# Patient Record
Sex: Female | Born: 1935 | Hispanic: No | Smoking: Former smoker
Health system: Southern US, Community
[De-identification: ages and names within clinical notes are randomized; demographics above are authoritative.]

## PROBLEM LIST (undated history)

## (undated) DIAGNOSIS — M810 Age-related osteoporosis without current pathological fracture: Secondary | ICD-10-CM

## (undated) DIAGNOSIS — I10 Essential (primary) hypertension: Secondary | ICD-10-CM

## (undated) DIAGNOSIS — H3530 Unspecified macular degeneration: Secondary | ICD-10-CM

## (undated) DIAGNOSIS — G629 Polyneuropathy, unspecified: Secondary | ICD-10-CM

## (undated) HISTORY — DX: Age-related osteoporosis without current pathological fracture: M81.0

## (undated) HISTORY — DX: Polyneuropathy, unspecified: G62.9

## (undated) HISTORY — DX: Essential (primary) hypertension: I10

## (undated) HISTORY — DX: Unspecified macular degeneration: H35.30

---

## 2011-04-15 ENCOUNTER — Ambulatory Visit (INDEPENDENT_AMBULATORY_CARE_PROVIDER_SITE_OTHER): Payer: Medicare Other

## 2011-04-15 DIAGNOSIS — J45909 Unspecified asthma, uncomplicated: Secondary | ICD-10-CM

## 2011-04-15 DIAGNOSIS — J069 Acute upper respiratory infection, unspecified: Secondary | ICD-10-CM

## 2011-07-21 ENCOUNTER — Ambulatory Visit: Payer: Medicare Other

## 2011-07-21 ENCOUNTER — Ambulatory Visit (INDEPENDENT_AMBULATORY_CARE_PROVIDER_SITE_OTHER): Payer: Medicare Other | Admitting: Family Medicine

## 2011-07-21 VITALS — BP 163/80 | HR 69 | Temp 97.6°F | Resp 18 | Ht 62.25 in | Wt 168.8 lb

## 2011-07-21 DIAGNOSIS — R05 Cough: Secondary | ICD-10-CM

## 2011-07-21 DIAGNOSIS — R059 Cough, unspecified: Secondary | ICD-10-CM

## 2011-07-21 DIAGNOSIS — R062 Wheezing: Secondary | ICD-10-CM

## 2011-07-21 DIAGNOSIS — R0989 Other specified symptoms and signs involving the circulatory and respiratory systems: Secondary | ICD-10-CM

## 2011-07-21 MED ORDER — BENZONATATE 100 MG PO CAPS: 100.0000 mg | ORAL_CAPSULE | Freq: Two times a day (BID) | ORAL | Status: AC | PRN

## 2011-07-21 NOTE — Progress Notes (Signed)
[facility]:  Office Visit  Chief Complaint:  Chief Complaint  Patient presents with  . Cough  . Wheezing    HPI: Katrina Beck is a 78 y.o. female who complains of  Recurrent dry cough. Denies any other symptoms. Denies allergies/asthma or reflux. She has had this for about 3-4 weeks. Has been seen twice. Her Doctor in [place], [place] gave her a Z-pack about 2 weeks and that seems to improve her symptoms. She is leaving to go to [place] in 10 days and wants to make sure she is not sick.   Past Medical History  Diagnosis Date  . Macular degeneration   . Hypertension   . Neuropathy   . Asthma    History reviewed. No pertinent past surgical history. History   Social History  . Marital Status: Unknown    Spouse Name: N/A    Number of Children: N/A  . Years of Education: N/A   Social History Main Topics  . Smoking status: Former Games developer  . Smokeless tobacco: None  . Alcohol Use: No  . Drug Use: No  . Sexually Active: None   Other Topics Concern  . None   Social History Narrative  . None   No family history on file. No Known Allergies Prior to Admission medications   Medication Sig Start Date End Date Taking? Authorizing Provider  amLODipine-benazepril (LOTREL) 10-20 MG per capsule Take 1 capsule by mouth daily.   Yes Historical Provider, MD  benzonatate (TESSALON) 100 MG capsule Take 1 capsule (100 mg total) by mouth 2 (two) times daily as needed for cough. 07/21/11 07/28/11  Khris Jansson P Abrahm Mancia, DO  gabapentin (NEURONTIN) 100 MG capsule Take 100 mg by mouth 3 (three) times daily.   Yes Historical Provider, MD  glucosamine-chondroitin 500-400 MG tablet Take 1 tablet by mouth 3 (three) times daily.   Yes Historical Provider, MD  HYDROcodone-acetaminophen (NORCO) 10-325 MG per tablet Take 1 tablet by mouth every 6 (six) hours as needed.   Yes Historical Provider, MD     ROS: The patient denies fevers, chills, night sweats, unintentional weight loss, chest pain,  palpitations, wheezing, dyspnea on exertion, nausea, vomiting, abdominal pain, dysuria, hematuria, melena, numbness, weakness, or tingling.   All other systems have been reviewed and were otherwise negative with the exception of those mentioned in the HPI and as above.    PHYSICAL EXAM: Filed Vitals:   07/21/11 1948  BP: 163/80  Pulse: 69  Temp: 97.6 F (36.4 C)  Resp: 18  Spo2 97% Filed Vitals:   07/21/11 1948  Height: 5' 2.25" (1.581 m)  Weight: 168 lb 12.8 oz (76.567 kg)   Body mass index is 30.63 kg/(m^2).  General: Alert, no acute distress HEENT:  Normocephalic, atraumatic, oropharynx patent. TM nl, no exudates, no sinus tenderness. EOMI, everted lower right  eye lid.  Cardiovascular:  Regular rate and rhythm, no rubs murmurs or gallops.  No Carotid bruits, radial pulse intact. No pedal edema.  Respiratory: Clear to auscultation bilaterally.  No wheezes, rales, or rhonchi.  No cyanosis, no use of accessory musculature GI: No organomegaly, abdomen is soft and non-tender, positive bowel sounds.  No masses. Skin: No rashes. Neurologic: Facial musculature symmetric. Psychiatric: Patient is appropriate throughout our interaction. Lymphatic: No cervical lymphadenopathy Musculoskeletal: Gait intact.   LABS: No results found for this or any previous visit.   EKG/XRAY:   Primary read interpreted by Dr. Conley Rolls at [facility]. + Kyphosis, tortuous pulmonary and aortic vasculature. No  pneumo, no effusion. ? Right lower lobe/posterior density?   ASSESSMENT/PLAN: Encounter Diagnoses  Name Primary?  . Cough Yes  . Wheeze    Will rx only tessalon perles for cough Will f/u on xray, her xray is difficult to read due to the tortuous nature of aorta and cardiomegaly. Unlikely pneumonia. IF anything it is bronchitic in appearance. Will rx z-pack prn after official xray results.     Kaylon Hitz PHUONG, DO 07/23/2011 8:14 AM

## 2011-07-22 ENCOUNTER — Telehealth: Payer: Self-pay | Admitting: Family Medicine

## 2011-07-22 MED ORDER — AZITHROMYCIN 250 MG PO TABS: ORAL_TABLET | ORAL | Status: AC

## 2011-07-22 NOTE — Telephone Encounter (Signed)
LM regarding CXR results. NO PNA. THere are some chronic changes. Continue with Tessalon PErles. She may take z-pack if her sxs get worse. Sent in rx to pharmacy.

## 2011-07-23 ENCOUNTER — Encounter: Payer: Self-pay | Admitting: Family Medicine

## 2011-09-27 ENCOUNTER — Ambulatory Visit (INDEPENDENT_AMBULATORY_CARE_PROVIDER_SITE_OTHER): Payer: Medicare Other | Admitting: Family Medicine

## 2011-09-27 VITALS — BP 184/108 | HR 84 | Temp 97.7°F | Resp 20 | Ht 62.25 in

## 2011-09-27 DIAGNOSIS — I10 Essential (primary) hypertension: Secondary | ICD-10-CM

## 2011-09-27 DIAGNOSIS — R51 Headache: Secondary | ICD-10-CM

## 2011-09-27 MED ORDER — BENAZEPRIL HCL 10 MG PO TABS: 10.0000 mg | ORAL_TABLET | Freq: Every day | ORAL | Status: DC

## 2011-09-27 NOTE — Progress Notes (Signed)
Patient Name: Katrina Beck Date of Birth: Aug 31, 1912 Medical Record Number: [id number] Gender: female Date of Encounter: 09/27/2011  History of Present Illness:  Katrina Beck is a 78 y.o. very pleasant female patient who presents with the following:  She noted that she felt dizzy when she got out of bed this morning.  She has also noted a HA today.  She and her son went to a drug store and they noted that her BP was 210/ 124 by the machine at the store.   The dizzy feeling only lasted for a few seconds, but she still feels unsteady and a little off balance.  She has been taking her BP medication faithfully.  No other cardiac issues except for HTN.   No CP or SOB.  She did take a tylenol for her HA but it did not really help.  She has not had any numbness or weakness, no slurred speech or difficulty chewing.  She has some stiffness and needs a cane to walk but this is not new.  She was actaully changed from lotrel to toprol xl 25mg  a few months ago by her PCP in [place].  This change was done due to foot and ankle swelling.  Her swelling has gotten better.    There is no problem list on file for this patient.  Past Medical History  Diagnosis Date  . Macular degeneration   . Hypertension   . Neuropathy   . Asthma    No past surgical history on file. History  Substance Use Topics  . Smoking status: Former Games developer  . Smokeless tobacco: Not on file  . Alcohol Use: No   No family history on file. No Known Allergies  Medication list has been reviewed and updated.  Prior to Admission medications   Medication Sig Start Date End Date Taking? Authorizing Provider  amLODipine-benazepril (LOTREL) 10-20 MG per capsule Take 1 capsule by mouth daily.   Yes Historical Provider, MD  gabapentin (NEURONTIN) 100 MG capsule Take 100 mg by mouth 3 (three) times daily.   Yes Historical Provider, MD  glucosamine-chondroitin 500-400 MG tablet Take 1 tablet by mouth 3 (three) times daily.   Yes  Historical Provider, MD  HYDROcodone-acetaminophen (NORCO) 10-325 MG per tablet Take 1 tablet by mouth every 6 (six) hours as needed.   Yes Historical Provider, MD    Review of Systems:  As per HPI- otherwise negative. Here today with her son  Physical Examination: Filed Vitals:   09/27/11 1751  BP: 184/108  Pulse: 84  Temp: 97.7 F (36.5 C)  Resp: 20   Filed Vitals:   09/27/11 1751  Height: 5' 2.25" (1.581 m)   There is no weight on file to calculate BMI.  GEN: WDWN, NAD, Non-toxic, A & O x 3, somewhat frail but looks great for her age.   HEENT: Atraumatic, Normocephalic. Neck supple. No masses, No LAD.  Suspect chronic blepharitis bilaterlly, TM wnl, oropharynx wnl, PEERL, EOMI Ears and Nose: No external deformity. CV: RRR, No M/G/R. No JVD. No thrill. No extra heart sounds. PULM: CTA B, no wheezes, crackles, rhonchi. No retractions. No resp. distress. No accessory muscle use. EXTR: No c/c/e NEURO slow gait but not unstable.  Facial muscle function symmetrical and full bilaterally.  Normal strength and sensation all extremities, normal cerebellar function and RAM.   PSYCH: Normally interactive. Conversant. Not depressed or anxious appearing.  Calm demeanor.   EKG: sinus rhythm.  Right BBB limits further interpretatation.  No old  EKG on EPIC or paper chart for comparison   Assessment and Plan: 1. Hypertension  EKG 12-Lead, benazepril (LOTENSIN) 10 MG tablet  2. Headache     Elevated BP and headache.  HA is not terribly severe.  It seems that she had a bit of vertigo this morning- neuro exam has no focal abnormalities.  Recheck BP- 180/ 100. At last visit her BP was 160/ 80.   Explained that I cannot rule- out a serious cause of her symptoms such as a stroke or cardiac problem here.  I cannot know for certain if her EKG changes are new or old.  Offered to facilitate an ER visit for further evaluation, CT head, etc.  However the patient and her son both preferred to take her  home and try adding a second BP medication.  Will add a low dose of benazepril in the evening- she has been on this before but stopped due to swelling which was likely due to norvasc.  If she gets worse or notes any focal neurologic signs, or has any CP they will seek further care.    Abbe Amsterdam, MD

## 2011-09-28 ENCOUNTER — Telehealth: Payer: Self-pay | Admitting: *Deleted

## 2011-09-28 NOTE — Telephone Encounter (Signed)
Called and left patient a message stating concern of how she is feeling today.

## 2011-10-26 ENCOUNTER — Ambulatory Visit (INDEPENDENT_AMBULATORY_CARE_PROVIDER_SITE_OTHER): Payer: Medicare Other | Admitting: Family Medicine

## 2011-10-26 VITALS — BP 200/102 | HR 66 | Temp 98.1°F | Resp 18 | Ht 59.0 in | Wt 149.6 lb

## 2011-10-26 DIAGNOSIS — I10 Essential (primary) hypertension: Secondary | ICD-10-CM

## 2011-10-26 MED ORDER — CLONIDINE HCL 0.1 MG PO TABS: 0.1000 mg | ORAL_TABLET | Freq: Once | ORAL | Status: DC

## 2011-10-26 MED ORDER — CLONIDINE HCL 0.1 MG PO TABS: 0.0500 mg | ORAL_TABLET | Freq: Once | ORAL | Status: DC

## 2011-10-26 NOTE — Progress Notes (Signed)
[facility]:  Office Visit  Chief Complaint:  Chief Complaint  Patient presents with  . Hypertension    Blood Pressure elevated today x today     HPI: Kendelle Schweers is a 78 y.o. female who complains of: High blood pressure. Was on Lotrel and taken off of it several months back by PCP in [place], [place] . Was taken off Loterl due to swelling. She was only on Metoprolol after that. She came in to see Dr. Patsy Lager and had elevated BP. She was put on Benzapril 10 mg. Today she is here because BP was elevated. NO CVA sxs.   Past Medical History  Diagnosis Date  . Macular degeneration   . Hypertension   . Neuropathy   . Asthma    No past surgical history on file. History   Social History  . Marital Status: Unknown    Spouse Name: N/A    Number of Children: N/A  . Years of Education: N/A   Social History Main Topics  . Smoking status: Former Games developer  . Smokeless tobacco: None  . Alcohol Use: No  . Drug Use: No  . Sexually Active: None   Other Topics Concern  . None   Social History Narrative  . None   No family history on file. No Known Allergies Prior to Admission medications   Medication Sig Start Date End Date Taking? Authorizing Provider  benazepril (LOTENSIN) 10 MG tablet Take 1 tablet (10 mg total) by mouth daily. 09/27/11 09/26/12 Yes Jessica C Copland, MD  gabapentin (NEURONTIN) 100 MG capsule Take 100 mg by mouth 3 (three) times daily.   Yes Historical Provider, MD  glucosamine-chondroitin 500-400 MG tablet Take 1 tablet by mouth 3 (three) times daily.   Yes Historical Provider, MD  HYDROcodone-acetaminophen (NORCO) 10-325 MG per tablet Take 1 tablet by mouth every 6 (six) hours as needed.   Yes Historical Provider, MD  metoprolol succinate (TOPROL-XL) 25 MG 24 hr tablet Take 25 mg by mouth daily.   Yes Historical Provider, MD     ROS: The patient denies fevers, chills, night sweats, unintentional weight loss, chest pain, palpitations, wheezing,  dyspnea on exertion, nausea, vomiting, abdominal pain, dysuria, hematuria, melena, numbness, weakness, or tingling.   All other systems have been reviewed and were otherwise negative with the exception of those mentioned in the HPI and as above.    PHYSICAL EXAM: Filed Vitals:   10/26/11 1716  BP: 200/102  Pulse: 66  Temp: 98.1 F (36.7 C)  Resp: 18   Filed Vitals:   10/26/11 1716  Height: 4\' 11"  (1.499 m)  Weight: 149 lb 9.6 oz (67.858 kg)   Body mass index is 30.22 kg/(m^2). Repeat 190/100 Repeat after clonidine 0.05 mg was 180/90.   General: Alert, no acute distress HEENT:  Normocephalic, atraumatic, oropharynx patent.  Cardiovascular:  Regular rate and rhythm, no rubs. or gallops. + systolic murmur. No Carotid bruits, radial pulse intact. Minimal pedal edema.  Respiratory: Minimal wheeze secondary to h/o asthma, No rales, or rhonchi.  No cyanosis, no use of accessory musculature GI: No organomegaly, abdomen is soft and non-tender, positive bowel sounds.  No masses. Skin: No rashes. Neurologic: Facial musculature symmetric. Psychiatric: Patient is appropriate throughout our interaction. Lymphatic: No cervical lymphadenopathy Musculoskeletal: Gait intact.   LABS: No results found for this or any previous visit.   EKG/XRAY:   Primary read interpreted by Dr. Conley Rolls at [facility].   ASSESSMENT/PLAN: Encounter Diagnosis  Name Primary?  Marland Kitchen  HTN (hypertension) Yes    Severe HTN asymptomatic. Clonidine 0.05 mg x 1 BP went down to 180/90 Will go home and get BP checks BID, will increase Banzarpil to 10 mg BID from daily.  FU in 1 week with BP readings Go to ER as needed   Nissim Fleischer PHUONG, DO 10/26/2011 5:44 PM

## 2011-10-27 ENCOUNTER — Telehealth: Payer: Self-pay | Admitting: Family Medicine

## 2011-10-28 ENCOUNTER — Other Ambulatory Visit: Payer: Self-pay | Admitting: Family Medicine

## 2011-10-28 DIAGNOSIS — I10 Essential (primary) hypertension: Secondary | ICD-10-CM

## 2011-10-28 MED ORDER — HYDROCHLOROTHIAZIDE 12.5 MG PO TABS: 12.5000 mg | ORAL_TABLET | Freq: Every day | ORAL | Status: DC

## 2011-10-29 ENCOUNTER — Other Ambulatory Visit (INDEPENDENT_AMBULATORY_CARE_PROVIDER_SITE_OTHER): Payer: Medicare Other

## 2011-10-29 ENCOUNTER — Telehealth: Payer: Self-pay | Admitting: Family Medicine

## 2011-10-29 ENCOUNTER — Other Ambulatory Visit: Payer: Self-pay | Admitting: Family Medicine

## 2011-10-29 VITALS — BP 160/102 | HR 66 | Temp 97.7°F | Resp 16

## 2011-10-29 DIAGNOSIS — I10 Essential (primary) hypertension: Secondary | ICD-10-CM

## 2011-10-29 DIAGNOSIS — Z0000 Encounter for general adult medical examination without abnormal findings: Secondary | ICD-10-CM

## 2011-10-29 LAB — BASIC METABOLIC PANEL WITH GFR
BUN: 36 mg/dL — ABNORMAL HIGH (ref 6–23)
CO2: 27 meq/L (ref 19–32)
Chloride: 107 meq/L (ref 96–112)
Creat: 1.44 mg/dL — ABNORMAL HIGH (ref 0.50–1.10)
Glucose, Bld: 80 mg/dL (ref 70–99)

## 2011-10-29 LAB — BASIC METABOLIC PANEL
Calcium: 9.4 mg/dL (ref 8.4–10.5)
Potassium: 4.6 mEq/L (ref 3.5–5.3)
Sodium: 141 mEq/L (ref 135–145)

## 2011-10-29 NOTE — Telephone Encounter (Signed)
Per son patient is already on Losartan rx by her doctor in [place], [place]. She is currently taking Losratan 100 mg , Metoprolol 25 mg , Benazapril 10 mg which we prescribed on her last visit her with elevated BP. Prior to that she was on the Metoprolol and Lotrel. She was taken off of Lotrel due to [place] edema. She went to[facility] about 1 month ago and was put on Losartan but apparently she still had high BP.   Patient lives in [place] and [place] between her son and her daughter so the changes in medication were not completely relayed from what I can tell.   I will dc the Benazapril for now and put her on HCTZ 12.5 mg  Daily, if her BP still is high then can increase to 25 mg if she does not have any SEs.  She is to get her BMP tested for base line kidney function since we do not have it.

## 2011-11-04 NOTE — Telephone Encounter (Signed)
Abner Greenspan had request a copy of pt chemistry panel and the faxed came in and had nothing on it. Please fax chemistry panel [phone] or contact [facility] [phone]

## 2011-11-07 ENCOUNTER — Encounter: Payer: Self-pay | Admitting: Family Medicine

## 2011-12-15 ENCOUNTER — Ambulatory Visit (INDEPENDENT_AMBULATORY_CARE_PROVIDER_SITE_OTHER): Payer: Medicare Other | Admitting: Family Medicine

## 2011-12-15 VITALS — BP 208/112 | HR 117 | Temp 98.0°F | Resp 17 | Ht 60.5 in | Wt 145.0 lb

## 2011-12-15 DIAGNOSIS — R109 Unspecified abdominal pain: Secondary | ICD-10-CM

## 2011-12-15 DIAGNOSIS — I10 Essential (primary) hypertension: Secondary | ICD-10-CM

## 2011-12-15 LAB — POCT CBC
Lymph, poc: 2.8 (ref 0.6–3.4)
MCH, POC: 27.5 pg (ref 27–31.2)
MCHC: 29.4 g/dL — AB (ref 31.8–35.4)
MCV: 93.4 fL (ref 80–97)
MID (cbc): 0.4 (ref 0–0.9)
POC LYMPH PERCENT: 31.5 %L (ref 10–50)
POC MID %: 3.9 %M (ref 0–12)
Platelet Count, POC: 400 10*3/uL (ref 142–424)
RDW, POC: 16 %
WBC: 9 10*3/uL (ref 4.6–10.2)

## 2011-12-15 LAB — COMPREHENSIVE METABOLIC PANEL
AST: 22 U/L (ref 0–37)
BUN: 46 mg/dL — ABNORMAL HIGH (ref 6–23)
Calcium: 10.2 mg/dL (ref 8.4–10.5)
Chloride: 101 mEq/L (ref 96–112)
Creat: 1.36 mg/dL — ABNORMAL HIGH (ref 0.50–1.10)
Total Bilirubin: 0.6 mg/dL (ref 0.3–1.2)

## 2011-12-15 MED ORDER — CLONIDINE HCL 0.1 MG PO TABS: 0.1000 mg | ORAL_TABLET | Freq: Three times a day (TID) | ORAL | Status: DC

## 2011-12-15 NOTE — Patient Instructions (Addendum)
Take blood pressure meds:  Toprol (metoprolol) increase to twice daily Hydrochlorothiazide 12.5 daily Benecar 40mg  one daily  Use the clonidine 0.1 mg maximum twice daily if blood pressure over 180/100   Try taking a dose of miralax to help bowels act better to try to relieve the gas cramping.  Return if worse  Recheck 1 week, sooner if worse.

## 2011-12-15 NOTE — Progress Notes (Signed)
Subjective: 78 year old lady who lives in [place] but spends a lot of time up here where her son lives. She is here visiting, though he is over in [place] she has a daughter in [place], but her daughter is traveling also at this time. Her primary physician is in [place]. Each time she is coming here she's had high blood pressure.  She had eaten some ham yesterday and then developed severe abdominal cramping. It let up after a while. It is continued to bother some off and on. This morning she still had some discomfort, though not like yesterday. She slept fine last night.  Objective: Alert, fully oriented, elderly lady in no distress right now. Her throat is clear. Neck supple without nodes or thyromegaly. Chest is clear. Heart regular without murmurs. Abdomen had active bowel sounds with no rushes or tinkles. It is soft without organomegaly mass or tenderness. She has a large low abdominal midline scar from her hysterectomy. She's also had some kidney surgery. Had one kidney removed. Ask to reduce without major edema.  Assessment: Abdominal pains, probably secondary to tracking of gas Uncontrolled hypertension  Plan: Check a CBC, treat blood pressure   Results for orders placed in visit on 12/15/11  POCT CBC      Component Value Range   WBC 9.0  4.6 - 10.2 K/uL   Lymph, poc 2.8  0.6 - 3.4   POC LYMPH PERCENT 31.5  10 - 50 %L   MID (cbc) 0.4  0 - 0.9   POC MID % 3.9  0 - 12 %M   POC Granulocyte 5.8  2 - 6.9   Granulocyte percent 64.6  37 - 80 %G   RBC 4.00 (*) 4.04 - 5.48 M/uL   Hemoglobin 11.0 (*) 12.2 - 16.2 g/dL   HCT, POC 65.7 (*) 84.6 - 47.9 %   MCV 93.4  80 - 97 fL   MCH, POC 27.5  27 - 31.2 pg   MCHC 29.4 (*) 31.8 - 35.4 g/dL   RDW, POC 96.2     Platelet Count, POC 400  142 - 424 K/uL   MPV 6.8  0 - 99.8 fL

## 2011-12-24 ENCOUNTER — Ambulatory Visit (INDEPENDENT_AMBULATORY_CARE_PROVIDER_SITE_OTHER): Payer: Medicare Other | Admitting: Family Medicine

## 2011-12-24 VITALS — BP 160/88 | HR 79 | Temp 97.6°F | Resp 18

## 2011-12-24 DIAGNOSIS — I10 Essential (primary) hypertension: Secondary | ICD-10-CM

## 2011-12-24 MED ORDER — HYDROCHLOROTHIAZIDE 12.5 MG PO TABS: ORAL_TABLET | ORAL | Status: DC

## 2011-12-24 MED ORDER — METOPROLOL SUCCINATE ER 25 MG PO TB24: 25.0000 mg | ORAL_TABLET | Freq: Two times a day (BID) | ORAL | Status: DC

## 2011-12-24 NOTE — Patient Instructions (Addendum)
Toprol (metoprolol) increase to twice daily 25 mg Hydrochlorothiazide 12.5 daily 1/2 pill daily Benecar 40mg  one daily  Use the catapres (clonidine) only for severe blood pressure elevation.

## 2011-12-24 NOTE — Progress Notes (Signed)
Subjective: Patient left here last time her blood pressure came down on the single dose of Catapres that I given her, and she did not get medications filled nor change medicine doses as suggested. Her son brought her back in here today because her blood pressure was high again this morning. They're going to [place] and: About a month, and wanted her rechecked to consider doing better.  Objective: Blood pressure was 178/96 when I checked it. Chest clear. Heart regular. Grade 1-2/6 systolic murmur.  Assessment: Hypertension, unsatisfactory control  Plan: She beginning his high salt meal yesterday. Urged her to cut back on the salt in her diet. Increase the metoprolol to twice daily. I realize it can be given 50 mg once daily but due to her age I would like to split it.  Start on hydrochlorothiazide, but will go with only 6.25 mg once daily to see if we can help the blood pressure. She does only have one kidney. We may go up on that if she tolerates it well.  Gave her the Catapres to take on a when necessary basis associated work while for her, and they will be traveling. 0.1 mg.

## 2012-09-13 ENCOUNTER — Ambulatory Visit: Payer: Medicare Other

## 2012-09-13 ENCOUNTER — Ambulatory Visit (INDEPENDENT_AMBULATORY_CARE_PROVIDER_SITE_OTHER): Payer: Medicare Other | Admitting: Family Medicine

## 2012-09-13 VITALS — BP 168/98 | HR 75 | Temp 97.9°F | Resp 18 | Ht 60.0 in | Wt 157.8 lb

## 2012-09-13 DIAGNOSIS — I509 Heart failure, unspecified: Secondary | ICD-10-CM

## 2012-09-13 DIAGNOSIS — R5383 Other fatigue: Secondary | ICD-10-CM

## 2012-09-13 DIAGNOSIS — R0609 Other forms of dyspnea: Secondary | ICD-10-CM

## 2012-09-13 DIAGNOSIS — R0600 Dyspnea, unspecified: Secondary | ICD-10-CM

## 2012-09-13 DIAGNOSIS — H02846 Edema of left eye, unspecified eyelid: Secondary | ICD-10-CM

## 2012-09-13 DIAGNOSIS — R5381 Other malaise: Secondary | ICD-10-CM

## 2012-09-13 DIAGNOSIS — R609 Edema, unspecified: Secondary | ICD-10-CM

## 2012-09-13 DIAGNOSIS — H02849 Edema of unspecified eye, unspecified eyelid: Secondary | ICD-10-CM

## 2012-09-13 LAB — POCT CBC
Granulocyte percent: 60.4 %G (ref 37–80)
HCT, POC: 29.6 % — AB (ref 37.7–47.9)
Hemoglobin: 9 g/dL — AB (ref 12.2–16.2)
POC Granulocyte: 5.3 (ref 2–6.9)
POC LYMPH PERCENT: 31.9 %L (ref 10–50)
RBC: 3.25 M/uL — AB (ref 4.04–5.48)
RDW, POC: 17.9 %

## 2012-09-13 LAB — COMPREHENSIVE METABOLIC PANEL
Albumin: 3.5 g/dL (ref 3.5–5.2)
Alkaline Phosphatase: 66 U/L (ref 39–117)
Chloride: 102 mEq/L (ref 96–112)
Glucose, Bld: 85 mg/dL (ref 70–99)
Potassium: 4.4 mEq/L (ref 3.5–5.3)
Sodium: 136 mEq/L (ref 135–145)
Total Protein: 6.6 g/dL (ref 6.0–8.3)

## 2012-09-13 MED ORDER — FUROSEMIDE 40 MG PO TABS: 40.0000 mg | ORAL_TABLET | Freq: Every day | ORAL | Status: DC

## 2012-09-13 NOTE — Progress Notes (Signed)
Subjective: 79 year old lady who just came back from a weekend trip to [place] for a walk this wedding. On the plane coming home she had more swelling and fatigue. She has continued to be fatigued and had dyspnea on exertion. She has only been back 2 days. No chest pains. Today she noticed eyelids a little puffy. They did eat a high salt diet and [place].  Objective: Has edema of the left upper eyelid in both lower orbits. Throat is clear. Neck supple. No JVD. Chest has a few basilar rales. Heart regular without murmurs. Abdomen soft. Her ankles have 3+ pitting edema.  [facility] reading (PRIMARY) by  Dr. Alwyn Ren Cardiomegaly with CHF and fluid at the bases.  EKG: Right bundle branch block pattern  Assessment: CHF with ankle edema, mild pulmonary edema, and eyelid edema.  Plan: Lasix 40 mg daily. Get rechecked in the next week or 2. She is going back to [facility]. Advised avoiding salt in her diet. Thank you.

## 2012-09-13 NOTE — Patient Instructions (Addendum)
Begin taking furosemide 40 mg one daily. If you are not seeming to lose a lot of fluid over the next 2 or 3 days she can increase to one and one half. If you do lose a lot of fluid, after one week cut to one half tablet daily. Return if worse or go to the emergency room. Return in 2 weeks for a recheck.

## 2012-09-28 ENCOUNTER — Encounter: Payer: Self-pay | Admitting: Family Medicine

## 2013-01-31 ENCOUNTER — Ambulatory Visit (INDEPENDENT_AMBULATORY_CARE_PROVIDER_SITE_OTHER): Payer: Medicare Other | Admitting: Family Medicine

## 2013-01-31 VITALS — BP 116/86 | HR 90 | Temp 98.5°F | Resp 18 | Ht 59.5 in | Wt 158.0 lb

## 2013-01-31 DIAGNOSIS — K529 Noninfective gastroenteritis and colitis, unspecified: Secondary | ICD-10-CM

## 2013-01-31 DIAGNOSIS — I509 Heart failure, unspecified: Secondary | ICD-10-CM

## 2013-01-31 DIAGNOSIS — K5289 Other specified noninfective gastroenteritis and colitis: Secondary | ICD-10-CM

## 2013-01-31 LAB — COMPREHENSIVE METABOLIC PANEL
ALT: 13 U/L (ref 0–35)
AST: 13 U/L (ref 0–37)
Albumin: 3.2 g/dL — ABNORMAL LOW (ref 3.5–5.2)
Alkaline Phosphatase: 56 U/L (ref 39–117)
BUN: 52 mg/dL — ABNORMAL HIGH (ref 6–23)
CO2: 23 mEq/L (ref 19–32)
Calcium: 9.2 mg/dL (ref 8.4–10.5)
Chloride: 101 mEq/L (ref 96–112)
Creat: 2.51 mg/dL — ABNORMAL HIGH (ref 0.50–1.10)
Glucose, Bld: 116 mg/dL — ABNORMAL HIGH (ref 70–99)
Potassium: 5.4 mEq/L — ABNORMAL HIGH (ref 3.5–5.3)
Sodium: 134 mEq/L — ABNORMAL LOW (ref 135–145)
Total Bilirubin: 0.4 mg/dL (ref 0.3–1.2)
Total Protein: 6.8 g/dL (ref 6.0–8.3)

## 2013-01-31 LAB — POCT CBC
Granulocyte percent: 73.7 %G (ref 37–80)
HCT, POC: 30.5 % — AB (ref 37.7–47.9)
Hemoglobin: 9.1 g/dL — AB (ref 12.2–16.2)
Lymph, poc: 3 (ref 0.6–3.4)
MCH, POC: 27.4 pg (ref 27–31.2)
MCHC: 29.8 g/dL — AB (ref 31.8–35.4)
MCV: 92 fL (ref 80–97)
MID (cbc): 0.6 (ref 0–0.9)
MPV: 7.3 fL (ref 0–99.8)
POC Granulocyte: 10.1 — AB (ref 2–6.9)
POC LYMPH PERCENT: 21.8 %L (ref 10–50)
POC MID %: 4.5 %M (ref 0–12)
Platelet Count, POC: 421 10*3/uL (ref 142–424)
RBC: 3.32 M/uL — AB (ref 4.04–5.48)
RDW, POC: 17.6 %
WBC: 13.7 10*3/uL — AB (ref 4.6–10.2)

## 2013-01-31 MED ORDER — METRONIDAZOLE 250 MG PO TABS: 250.0000 mg | ORAL_TABLET | Freq: Three times a day (TID) | ORAL | Status: DC
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
## 2013-02-02 ENCOUNTER — Encounter: Payer: Self-pay | Admitting: Family Medicine

## 2013-04-09 ENCOUNTER — Ambulatory Visit (INDEPENDENT_AMBULATORY_CARE_PROVIDER_SITE_OTHER): Payer: Medicare Other | Admitting: Family Medicine

## 2013-04-09 ENCOUNTER — Encounter: Payer: Self-pay | Admitting: Family Medicine

## 2013-04-09 VITALS — BP 148/112 | HR 71 | Temp 98.5°F | Resp 16 | Ht 61.0 in | Wt 148.0 lb

## 2013-04-09 DIAGNOSIS — K140 Glossitis: Secondary | ICD-10-CM

## 2013-04-09 DIAGNOSIS — K219 Gastro-esophageal reflux disease without esophagitis: Secondary | ICD-10-CM

## 2013-04-09 DIAGNOSIS — I10 Essential (primary) hypertension: Secondary | ICD-10-CM

## 2013-04-09 LAB — COMPREHENSIVE METABOLIC PANEL
ALT: 13 U/L (ref 0–35)
AST: 16 U/L (ref 0–37)
Albumin: 3.7 g/dL (ref 3.5–5.2)
Alkaline Phosphatase: 64 U/L (ref 39–117)
BUN: 37 mg/dL — ABNORMAL HIGH (ref 6–23)
CO2: 26 mEq/L (ref 19–32)
Calcium: 9.5 mg/dL (ref 8.4–10.5)
Chloride: 101 mEq/L (ref 96–112)
Creat: 1.71 mg/dL — ABNORMAL HIGH (ref 0.50–1.10)
Glucose, Bld: 117 mg/dL — ABNORMAL HIGH (ref 70–99)
Potassium: 4.4 mEq/L (ref 3.5–5.3)
Sodium: 135 mEq/L (ref 135–145)
Total Bilirubin: 0.4 mg/dL (ref 0.3–1.2)
Total Protein: 6.6 g/dL (ref 6.0–8.3)

## 2013-04-09 MED ORDER — OMEPRAZOLE 20 MG PO CPDR: 20.0000 mg | DELAYED_RELEASE_CAPSULE | Freq: Every day | ORAL | Status: DC

## 2013-04-09 MED ORDER — CLONIDINE HCL 0.1 MG PO TABS: 0.1000 mg | ORAL_TABLET | Freq: Three times a day (TID) | ORAL | Status: AC

## 2013-04-09 NOTE — Patient Instructions (Signed)
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
## 2013-04-18 ENCOUNTER — Telehealth: Payer: Self-pay

## 2013-04-18 NOTE — Telephone Encounter (Signed)
Katrina Beck WOULD LIKE [place] TO FAX THE OV,LABS THAT WAS DONE ON HER MOM, STATES WE HAVE DONE IT BEFORE YOU MAY CALL [facility] AT [phone] AND ASK FOR RENEE AND THE PHONE NUMBER IS [phone] FOR DR Merry Proud Katrina Beck CAN BE REACHED AT [phone] IF NEEDED

## 2013-05-19 ENCOUNTER — Emergency Department (HOSPITAL_COMMUNITY): Payer: Medicare Other

## 2013-05-19 ENCOUNTER — Inpatient Hospital Stay (HOSPITAL_COMMUNITY)
Admission: EM | Admit: 2013-05-19 | Discharge: 2013-06-17 | DRG: 872 | Disposition: E | Payer: Medicare Other | Attending: Internal Medicine | Admitting: Internal Medicine

## 2013-05-19 ENCOUNTER — Ambulatory Visit (INDEPENDENT_AMBULATORY_CARE_PROVIDER_SITE_OTHER): Payer: Medicare Other | Admitting: Family Medicine

## 2013-05-19 VITALS — BP 136/62 | HR 38 | Temp 98.2°F | Ht 59.5 in | Wt 144.2 lb

## 2013-05-19 DIAGNOSIS — R001 Bradycardia, unspecified: Secondary | ICD-10-CM

## 2013-05-19 DIAGNOSIS — Z87891 Personal history of nicotine dependence: Secondary | ICD-10-CM

## 2013-05-19 DIAGNOSIS — T465X5A Adverse effect of other antihypertensive drugs, initial encounter: Secondary | ICD-10-CM | POA: Diagnosis present

## 2013-05-19 DIAGNOSIS — G589 Mononeuropathy, unspecified: Secondary | ICD-10-CM | POA: Diagnosis present

## 2013-05-19 DIAGNOSIS — K5900 Constipation, unspecified: Secondary | ICD-10-CM | POA: Diagnosis present

## 2013-05-19 DIAGNOSIS — J45909 Unspecified asthma, uncomplicated: Secondary | ICD-10-CM | POA: Diagnosis present

## 2013-05-19 DIAGNOSIS — E871 Hypo-osmolality and hyponatremia: Secondary | ICD-10-CM | POA: Diagnosis present

## 2013-05-19 DIAGNOSIS — I441 Atrioventricular block, second degree: Secondary | ICD-10-CM | POA: Diagnosis present

## 2013-05-19 DIAGNOSIS — Z515 Encounter for palliative care: Secondary | ICD-10-CM

## 2013-05-19 DIAGNOSIS — I10 Essential (primary) hypertension: Secondary | ICD-10-CM

## 2013-05-19 DIAGNOSIS — A419 Sepsis, unspecified organism: Principal | ICD-10-CM | POA: Diagnosis not present

## 2013-05-19 DIAGNOSIS — D649 Anemia, unspecified: Secondary | ICD-10-CM | POA: Diagnosis present

## 2013-05-19 DIAGNOSIS — I498 Other specified cardiac arrhythmias: Secondary | ICD-10-CM

## 2013-05-19 DIAGNOSIS — I4510 Unspecified right bundle-branch block: Secondary | ICD-10-CM | POA: Diagnosis present

## 2013-05-19 DIAGNOSIS — N189 Chronic kidney disease, unspecified: Secondary | ICD-10-CM | POA: Diagnosis present

## 2013-05-19 DIAGNOSIS — Z79899 Other long term (current) drug therapy: Secondary | ICD-10-CM

## 2013-05-19 DIAGNOSIS — M810 Age-related osteoporosis without current pathological fracture: Secondary | ICD-10-CM | POA: Diagnosis present

## 2013-05-19 DIAGNOSIS — I350 Nonrheumatic aortic (valve) stenosis: Secondary | ICD-10-CM

## 2013-05-19 DIAGNOSIS — N179 Acute kidney failure, unspecified: Secondary | ICD-10-CM | POA: Diagnosis present

## 2013-05-19 DIAGNOSIS — K810 Acute cholecystitis: Secondary | ICD-10-CM | POA: Diagnosis present

## 2013-05-19 DIAGNOSIS — K8010 Calculus of gallbladder with chronic cholecystitis without obstruction: Secondary | ICD-10-CM | POA: Diagnosis present

## 2013-05-19 DIAGNOSIS — I129 Hypertensive chronic kidney disease with stage 1 through stage 4 chronic kidney disease, or unspecified chronic kidney disease: Secondary | ICD-10-CM | POA: Diagnosis present

## 2013-05-19 DIAGNOSIS — Z66 Do not resuscitate: Secondary | ICD-10-CM | POA: Diagnosis present

## 2013-05-19 DIAGNOSIS — R6520 Severe sepsis without septic shock: Secondary | ICD-10-CM

## 2013-05-19 DIAGNOSIS — I359 Nonrheumatic aortic valve disorder, unspecified: Secondary | ICD-10-CM

## 2013-05-19 DIAGNOSIS — H3530 Unspecified macular degeneration: Secondary | ICD-10-CM | POA: Diagnosis present

## 2013-05-19 DIAGNOSIS — E8779 Other fluid overload: Secondary | ICD-10-CM | POA: Diagnosis not present

## 2013-05-19 DIAGNOSIS — K8000 Calculus of gallbladder with acute cholecystitis without obstruction: Secondary | ICD-10-CM | POA: Diagnosis not present

## 2013-05-19 LAB — CBC WITH DIFFERENTIAL/PLATELET
BASOS PCT: 0 % (ref 0–1)
Basophils Absolute: 0 10*3/uL (ref 0.0–0.1)
Eosinophils Absolute: 0 10*3/uL (ref 0.0–0.7)
Eosinophils Relative: 0 % (ref 0–5)
HEMATOCRIT: 24.8 % — AB (ref 36.0–46.0)
HEMOGLOBIN: 8.3 g/dL — AB (ref 12.0–15.0)
Lymphocytes Relative: 23 % (ref 12–46)
Lymphs Abs: 2.6 10*3/uL (ref 0.7–4.0)
MCH: 28.5 pg (ref 26.0–34.0)
MCHC: 33.5 g/dL (ref 30.0–36.0)
MCV: 85.2 fL (ref 78.0–100.0)
MONO ABS: 1.2 10*3/uL — AB (ref 0.1–1.0)
Monocytes Relative: 10 % (ref 3–12)
NEUTROS ABS: 7.6 10*3/uL (ref 1.7–7.7)
Neutrophils Relative %: 67 % (ref 43–77)
Platelets: 301 10*3/uL (ref 150–400)
RBC: 2.91 MIL/uL — ABNORMAL LOW (ref 3.87–5.11)
RDW: 16.4 % — ABNORMAL HIGH (ref 11.5–15.5)
WBC: 11.4 10*3/uL — ABNORMAL HIGH (ref 4.0–10.5)

## 2013-05-19 LAB — URINE MICROSCOPIC-ADD ON

## 2013-05-19 LAB — URINALYSIS, ROUTINE W REFLEX MICROSCOPIC
Bilirubin Urine: NEGATIVE
GLUCOSE, UA: NEGATIVE mg/dL
Hgb urine dipstick: NEGATIVE
Ketones, ur: NEGATIVE mg/dL
NITRITE: NEGATIVE
Protein, ur: 30 mg/dL — AB
Specific Gravity, Urine: 1.012 (ref 1.005–1.030)
Urobilinogen, UA: 0.2 mg/dL (ref 0.0–1.0)
pH: 5 (ref 5.0–8.0)

## 2013-05-19 LAB — COMPREHENSIVE METABOLIC PANEL
ALBUMIN: 3 g/dL — AB (ref 3.5–5.2)
ALT: 18 U/L (ref 0–35)
AST: 18 U/L (ref 0–37)
Alkaline Phosphatase: 94 U/L (ref 39–117)
BUN: 42 mg/dL — ABNORMAL HIGH (ref 6–23)
CO2: 23 mEq/L (ref 19–32)
Calcium: 8.9 mg/dL (ref 8.4–10.5)
Chloride: 92 mEq/L — ABNORMAL LOW (ref 96–112)
Creatinine, Ser: 2.24 mg/dL — ABNORMAL HIGH (ref 0.50–1.10)
GFR calc Af Amer: 20 mL/min — ABNORMAL LOW (ref >90)
GFR, EST NON AFRICAN AMERICAN: 17 mL/min — AB (ref >90)
Glucose, Bld: 104 mg/dL — ABNORMAL HIGH (ref 70–99)
Potassium: 4.6 mEq/L (ref 3.7–5.3)
Sodium: 129 mEq/L — ABNORMAL LOW (ref 137–147)
Total Bilirubin: 0.4 mg/dL (ref 0.3–1.2)
Total Protein: 6.6 g/dL (ref 6.0–8.3)

## 2013-05-19 LAB — MAGNESIUM: Magnesium: 2.6 mg/dL — ABNORMAL HIGH (ref 1.5–2.5)

## 2013-05-19 LAB — TROPONIN I

## 2013-05-19 LAB — MRSA PCR SCREENING: MRSA by PCR: NEGATIVE

## 2013-05-19 MED ORDER — ENOXAPARIN SODIUM 30 MG/0.3ML ~~LOC~~ SOLN
30.0000 mg | SUBCUTANEOUS | Status: DC
  Administered 2013-05-19 – 2013-05-23 (×5): 30 mg via SUBCUTANEOUS
  Filled 2013-05-19 (×6): qty 0.3

## 2013-05-19 MED ORDER — AMLODIPINE BESYLATE 10 MG PO TABS
10.0000 mg | ORAL_TABLET | Freq: Every day | ORAL | Status: DC
  Administered 2013-05-19 – 2013-05-20 (×2): 10 mg via ORAL
  Filled 2013-05-19 (×2): qty 1

## 2013-05-19 MED ORDER — SIMVASTATIN 10 MG PO TABS
10.0000 mg | ORAL_TABLET | ORAL | Status: DC
  Filled 2013-05-19 (×2): qty 1

## 2013-05-19 MED ORDER — SODIUM CHLORIDE 0.9 % IV SOLN
INTRAVENOUS | Status: DC
  Administered 2013-05-19: 21:00:00 via INTRAVENOUS

## 2013-05-19 MED ORDER — HYDRALAZINE HCL 25 MG PO TABS
25.0000 mg | ORAL_TABLET | Freq: Four times a day (QID) | ORAL | Status: DC
  Administered 2013-05-19 – 2013-05-20 (×3): 25 mg via ORAL
  Filled 2013-05-19 (×6): qty 1

## 2013-05-19 MED ORDER — ACETAMINOPHEN 325 MG PO TABS
650.0000 mg | ORAL_TABLET | Freq: Four times a day (QID) | ORAL | Status: DC | PRN
  Administered 2013-05-20: 650 mg via ORAL
  Filled 2013-05-19 (×2): qty 2

## 2013-05-19 MED ORDER — GABAPENTIN 100 MG PO CAPS
100.0000 mg | ORAL_CAPSULE | Freq: Every day | ORAL | Status: DC
  Administered 2013-05-20 – 2013-05-23 (×3): 100 mg via ORAL
  Filled 2013-05-19 (×5): qty 1

## 2013-05-19 MED ORDER — ACETAMINOPHEN 650 MG RE SUPP: 650.0000 mg | Freq: Four times a day (QID) | RECTAL | Status: DC | PRN

## 2013-05-19 MED ORDER — HYDRALAZINE HCL 20 MG/ML IJ SOLN: 10.0000 mg | INTRAMUSCULAR | Status: DC | PRN

## 2013-05-19 MED ORDER — ENOXAPARIN SODIUM 40 MG/0.4ML ~~LOC~~ SOLN: 40.0000 mg | SUBCUTANEOUS | Status: DC

## 2013-05-19 NOTE — ED Notes (Addendum)
Pt arrives from [place] via EMS for evaluation of low heart rate. Pt hr of 35 while at the doctors office, denies any pain or sob. Only c/o headache and diffuse ower abdominal pain. Pt currently on metoprolol and catapres. Alert and oriented, nad noted. EKG shows  Bradycardia.

## 2013-05-19 NOTE — Progress Notes (Signed)
Subjective: Almost 80 year old lady who comes in here with a history of having had constipation. She took milk of magnesia a couple of days ago and had 3 bowel movements. Since then she has been feeling extremely weak and tired. She feels like she might need to move her bowels again but has been unable to. She has not had chest pain or shortness of breath. She is just very fatigued.  She [place]-time [place] with [place] sign, part-time in [place] with her daughter. Her primary doctors are in [place]. She is on some regular medications, with her blood pressure medications being Catapres, metoprolol tartrate and furosemide. When she was here in December her blood pressure was quite high and her Catapres was increased to 3 times daily.  She remains quite alert though she says her activity level has decreased some.  Objective: Pleasant elderly lady accompanied by her son. She walks with a cane and assistance. Her throat is clear. Neck supple without nodes. Chest clear. Heart has a systolic murmur and a rate of only 38. Abdomen has very active bowel sounds. Is nontender.   Assessment: Severe bradycardia History of hypertension, controlled Constipation with laxative use Extreme fatigue  Plan: EKG Profound bradycardia, 2-1 block on rhythm strip with occasional episode of 4 beats a more regular rate   Oxygen  [facility] emergency room by EMS  [facility] at [facility]   I suspect that the Valsalva she did to get her bowels moving may have triggered her going into bradycardia arrhythmias.

## 2013-05-19 NOTE — ED Notes (Signed)
pts son refused to have CT head done

## 2013-05-19 NOTE — ED Notes (Signed)
Pt POA agreed to have CT head scan done

## 2013-05-19 NOTE — Patient Instructions (Signed)
[facility]

## 2013-05-19 NOTE — Consult Note (Addendum)
CARDIOLOGY CONSULT NOTE   Katrina Beck MRN: [id number] DOB/AGE: 78/06/1912 78 y.o. Admit date: 2013-09-06  Referring Physician:  Dr. Criss AlvineGoldston  Primary Physician: Janace Hoardavid Hopper  Primary Cardiologist: [place]. [place] [facility] [place]  Reason for consultation:  Bradycardia  HPI:  Katrina Beck is 80 year old female with h/o hypertension on Metoprolol who was brought to ER for DOE, HA and bradycardia to 30-40s, her normal HR is 60s.. She is accompanied by her son who does most of the history. She had gradual onset of HA recently but denied any chest pain, syncope and presyncope. No other reported focal neuro deficits. She recently had severe constipation and has been taking laxatives. She denies any fever, congestion or cough. No recent sick contact.    Review of systems: A review of 10 organ systems was done and is negative except as stated above in HPI  Past Medical History  Diagnosis Date  . Macular degeneration   . Hypertension   . Neuropathy   . Asthma   . Osteoporosis    No past surgical history on file. History   Social History  . Marital Status: Unknown    Spouse Name: N/A    Number of Children: N/A  . Years of Education: N/A   Occupational History  . Not on file.   Social History Main Topics  . Smoking status: Former Smoker -- 0.50 packs/day    Types: Cigarettes    Quit date: 12/14/1976  . Smokeless tobacco: Not on file  . Alcohol Use: No  . Drug Use: No  . Sexual Activity: Not on file   Other Topics Concern  . Not on file   Social History Narrative  . No narrative on file    No family history on file.   No Known Allergies   (Not in a hospital admission)  No current facility-administered medications for this encounter.   Current Outpatient Prescriptions  Medication Sig Dispense Refill  . beta carotene w/minerals (OCUVITE) tablet Take 1 tablet by mouth daily.      . cholecalciferol (VITAMIN D) 1000 UNITS tablet Take 2,000 Units by mouth daily.      . cloNIDine  (CATAPRES) 0.1 MG tablet Take 1 tablet (0.1 mg total) by mouth 3 (three) times daily.  90 tablet  3  . gabapentin (NEURONTIN) 100 MG capsule Take 100 mg by mouth daily.       . metoprolol succinate (TOPROL-XL) 25 MG 24 hr tablet Take 25 mg by mouth daily.      Marland Kitchen. omeprazole (PRILOSEC) 20 MG capsule Take 20 mg by mouth daily as needed (heartburn).      . Probiotic Product (ALIGN) 4 MG CAPS Take 4 mg by mouth every morning.      . simvastatin (ZOCOR) 20 MG tablet Take 10 mg by mouth 2 (two) times a week.         Physical Exam: Blood pressure 180/63, pulse 33, temperature 98.1 F (36.7 C), temperature source Oral, resp. rate 17, SpO2 100.00%.; There is no weight on file to calculate BMI. Temp:  [98.1 F (36.7 C)-98.2 F (36.8 C)] 98.1 F (36.7 C) (01/31 1327) Pulse Rate:  [32-38] 33 (01/31 1600) Resp:  [11-20] 17 (01/31 1600) BP: (136-191)/(42-70) 180/63 mmHg (01/31 1600) SpO2:  [77 %-100 %] 100 % (01/31 1600) Weight:  [144 lb 4 oz (65.431 kg)] 144 lb 4 oz (65.431 kg) (01/31 1107)  No intake or output data in the 24 hours ending 10-17-13 1724 General: NAD Heent: MMM Neck:  No JVD  CV: Nondisplaced PMI.  Brady but RRR, nl S1/S2, no S3/S4, 3/6 early peaking cresendo-decresendo systolic murmur best heard at LUSB. Lungs: Clear to auscultation bilaterally with normal respiratory effort Abdomen: Soft, nontender, nondistended Extremities: No clubbing or cyanosis.  Normal pedal pulses. No pedal edema Skin: Intact without lesions or rashes  Neurologic: Alert and oriented x 3, grossly nonfocal  Psych: Normal mood and affect    Labs:  Recent Labs  June 08, 2013 1536  TROPONINI <0.30   Lab Results  Component Value Date   WBC 11.4* 08-Jun-2013   HGB 8.3* 06-08-2013   HCT 24.8* 06/08/2013   MCV 85.2 06/08/2013   PLT 301 June 08, 2013    Recent Labs Lab 06/08/2013 1536  NA 129*  K 4.6  CL 92*  CO2 23  BUN 42*  CREATININE 2.24*  CALCIUM 8.9  PROT 6.6  BILITOT 0.4  ALKPHOS 94  ALT 18  AST 18   GLUCOSE 104*   No results found for this basename: CHOL, HDL, LDLCALC, TRIG       EKG:  2 to 1 AVB with normal sinu rthhym  Radiology:  Dg Chest 2 View  06-08-2013   CLINICAL DATA:  FATIGUE, WEAKNESS  EXAM: CHEST  2 VIEW  COMPARISON:  DG CHEST 2V dated 09/13/2012  FINDINGS: There is no focal parenchymal opacity, pleural effusion, or pneumothorax. THERE IS STABLE CARDIOMEGALY.  The osseous structures are unremarkable.  IMPRESSION: No acute cardiopulmonary disease.   Electronically Signed   By: Elige Ko   On: 06-08-13 16:06    ASSESSMENT:  80 yo female presents with headache, SOB and bradycardia and recent constipation   2:1 AVB likely 2/2 to beta blocker/clonidine accumulatiion with renal insufficiency and increased vagal tone. The QRS is relatively narrow therefore this is likely Mobitz I.   Likely mild aortic stenosis, per physical examination  Hyponatremia  PLAN:  1. Hold any AV nodal agents, no metoprolol, no clonidine. For increased BP, will order PRN hydralazine, and switch to Norvasc with/without PO hydralazine after acute phase.  2. Stepdown monitor with pacer pad placed. 3. Treat underlying process, electrolyte imbalance,constipation and pain.  4. Rule out CNS process, would suggest at least a CT head. 5. Echocardiogram in AM 6. Continue Cycle cardiac enzymes x3 7. NPO in case of progression of AVB to Morbitz II or 3rd degree and need of PPM 8. If hypotensive with bradycardia, start Dopamine infusion 9. Obtain records form [place], Dr. Lisette Grinder office.     Thank you for this consultation.  Will continue to follow.  Signed: Haydee Salter, MD Cardiology Fellow 06/08/2013, 5:24 PM

## 2013-05-19 NOTE — H&P (Addendum)
PCP:   GUEST, Veneda Melter, MD   Chief Complaint:  Bradycardia  HPI:  80 year old female who  has a past medical history of Macular degeneration; Hypertension; Neuropathy; Asthma; and Osteoporosis. today presented to the ED from the urgent care after patient was found to be bradycardic. As per patient she had constipation 2 days ago 4 that she took milk of magnesia and had 3 large bowel movements yesterday and has not been eating and drinking well. This morning patient tried to move also and was straining in the toilet, after the patient became very weak and became short of breath on exertion so she was seen at the urgent care clinic where she was found to have profound bradycardia with heart rate in 30s. Patient takes Catapres 0.1 3 times a day along with Toprol XL 25 mg daily. She took 1 dose of Catapres in the morning along with Toprol XL. In the ED patient is found to be having sinus bradycardia chest x-ray is negative for acute cortical disease, CT head is negative, patient continues to have high blood pressure with systolic in the range of 149F to 200. Patient denies chest pain, have nausea and vomiting x1. She denies fever no dysuria urgency or frequency of urination..   Allergies:  No Known Allergies    Past Medical History  Diagnosis Date  . Macular degeneration   . Hypertension   . Neuropathy   . Asthma   . Osteoporosis     No past surgical history on file.  Prior to Admission medications   Medication Sig Start Date End Date Taking? Authorizing Provider  beta carotene w/minerals (OCUVITE) tablet Take 1 tablet by mouth daily.   Yes Historical Provider, MD  cholecalciferol (VITAMIN D) 1000 UNITS tablet Take 2,000 Units by mouth daily.   Yes Historical Provider, MD  cloNIDine (CATAPRES) 0.1 MG tablet Take 1 tablet (0.1 mg total) by mouth 3 (three) times daily. 04/09/13  Yes Robyn Haber, MD  gabapentin (NEURONTIN) 100 MG capsule Take 100 mg by mouth daily.    Yes Historical  Provider, MD  metoprolol succinate (TOPROL-XL) 25 MG 24 hr tablet Take 25 mg by mouth daily.   Yes Historical Provider, MD  omeprazole (PRILOSEC) 20 MG capsule Take 20 mg by mouth daily as needed (heartburn).   Yes Historical Provider, MD  Probiotic Product (ALIGN) 4 MG CAPS Take 4 mg by mouth every morning.   Yes Historical Provider, MD  simvastatin (ZOCOR) 20 MG tablet Take 10 mg by mouth 2 (two) times a week.    Yes Historical Provider, MD    Social History:  reports that she quit smoking about 36 years ago. Her smoking use included Cigarettes. She smoked 0.50 packs per day. She does not have any smokeless tobacco history on file. She reports that she does not drink alcohol or use illicit drugs.    All the positives are listed in BOLD  Review of Systems:  HEENT: Headache, blurred vision, runny nose, sore throat Neck: Hypothyroidism, hyperthyroidism,,lymphadenopathy Chest : Shortness of breath, history of COPD, Asthma Heart : Chest pain, history of coronary arterey disease GI:  Female, constipation, GERD GU: Dysuria, urgency, frequency of urination, hematuria Neuro: Stroke, seizures, syncope Psych: Depression, anxiety, hallucinations   Physical Exam: Blood pressure 180/63, pulse 33, temperature 98.1 F (36.7 C), temperature source Oral, resp. rate 17, SpO2 100.00%. Constitutional:   Patient is a well-developed and well-nourished female in no acute distress and cooperative with exam. Head: Normocephalic and atraumatic Mouth: Mucus  membranes moist Eyes: PERRL, EOMI, conjunctivae normal Neck: Supple, No Thyromegaly Cardiovascular: RRR, S1 normal, S2 normal Pulmonary/Chest: CTAB, no wheezes, rales, or rhonchi Abdominal: Soft. Non-tender, non-distended, bowel sounds are normal, no masses, organomegaly, or guarding present.  Neurological: A&O x3, Strenght is normal and symmetric bilaterally, cranial nerve II-XII are grossly intact, no focal motor deficit, sensory intact to light touch  bilaterally.  Extremities : No Cyanosis, Clubbing or Edema   Labs on Admission:  Results for orders placed during the hospital encounter of 04/21/2013 (from the past 48 hour(s))  URINALYSIS, ROUTINE W REFLEX MICROSCOPIC     Status: Abnormal   Collection Time    05/07/2013  3:09 PM      Result Value Range   Color, Urine YELLOW  YELLOW   APPearance CLEAR  CLEAR   Specific Gravity, Urine 1.012  1.005 - 1.030   pH 5.0  5.0 - 8.0   Glucose, UA NEGATIVE  NEGATIVE mg/dL   Hgb urine dipstick NEGATIVE  NEGATIVE   Bilirubin Urine NEGATIVE  NEGATIVE   Ketones, ur NEGATIVE  NEGATIVE mg/dL   Protein, ur 30 (*) NEGATIVE mg/dL   Urobilinogen, UA 0.2  0.0 - 1.0 mg/dL   Nitrite NEGATIVE  NEGATIVE   Leukocytes, UA TRACE (*) NEGATIVE  URINE MICROSCOPIC-ADD ON     Status: Abnormal   Collection Time    04/30/2013  3:09 PM      Result Value Range   Squamous Epithelial / LPF MANY (*) RARE   WBC, UA 0-2  <3 WBC/hpf   Bacteria, UA RARE  RARE  CBC WITH DIFFERENTIAL     Status: Abnormal   Collection Time    05/15/2013  3:36 PM      Result Value Range   WBC 11.4 (*) 4.0 - 10.5 K/uL   RBC 2.91 (*) 3.87 - 5.11 MIL/uL   Hemoglobin 8.3 (*) 12.0 - 15.0 g/dL   HCT 24.8 (*) 36.0 - 46.0 %   MCV 85.2  78.0 - 100.0 fL   MCH 28.5  26.0 - 34.0 pg   MCHC 33.5  30.0 - 36.0 g/dL   RDW 16.4 (*) 11.5 - 15.5 %   Platelets 301  150 - 400 K/uL   Neutrophils Relative % 67  43 - 77 %   Neutro Abs 7.6  1.7 - 7.7 K/uL   Lymphocytes Relative 23  12 - 46 %   Lymphs Abs 2.6  0.7 - 4.0 K/uL   Monocytes Relative 10  3 - 12 %   Monocytes Absolute 1.2 (*) 0.1 - 1.0 K/uL   Eosinophils Relative 0  0 - 5 %   Eosinophils Absolute 0.0  0.0 - 0.7 K/uL   Basophils Relative 0  0 - 1 %   Basophils Absolute 0.0  0.0 - 0.1 K/uL  COMPREHENSIVE METABOLIC PANEL     Status: Abnormal   Collection Time    05/15/2013  3:36 PM      Result Value Range   Sodium 129 (*) 137 - 147 mEq/L   Potassium 4.6  3.7 - 5.3 mEq/L   Chloride 92 (*) 96 - 112  mEq/L   CO2 23  19 - 32 mEq/L   Glucose, Bld 104 (*) 70 - 99 mg/dL   BUN 42 (*) 6 - 23 mg/dL   Creatinine, Ser 2.24 (*) 0.50 - 1.10 mg/dL   Calcium 8.9  8.4 - 10.5 mg/dL   Total Protein 6.6  6.0 - 8.3 g/dL   Albumin 3.0 (*)  3.5 - 5.2 g/dL   AST 18  0 - 37 U/L   ALT 18  0 - 35 U/L   Alkaline Phosphatase 94  39 - 117 U/L   Total Bilirubin 0.4  0.3 - 1.2 mg/dL   GFR calc non Af Amer 17 (*) >90 mL/min   GFR calc Af Amer 20 (*) >90 mL/min   Comment: (NOTE)     The eGFR has been calculated using the CKD EPI equation.     This calculation has not been validated in all clinical situations.     eGFR's persistently <90 mL/min signify possible Chronic Kidney     Disease.  TROPONIN I     Status: None   Collection Time    05/15/2013  3:36 PM      Result Value Range   Troponin I <0.30  <0.30 ng/mL   Comment:            Due to the release kinetics of cTnI,     a negative result within the first hours     of the onset of symptoms does not rule out     myocardial infarction with certainty.     If myocardial infarction is still suspected,     repeat the test at appropriate intervals.  MAGNESIUM     Status: Abnormal   Collection Time    05/11/2013  3:36 PM      Result Value Range   Magnesium 2.6 (*) 1.5 - 2.5 mg/dL    Radiological Exams on Admission: Dg Chest 2 View  05/02/2013   CLINICAL DATA:  FATIGUE, WEAKNESS  EXAM: CHEST  2 VIEW  COMPARISON:  DG CHEST 2V dated 09/13/2012  FINDINGS: There is no focal parenchymal opacity, pleural effusion, or pneumothorax. THERE IS STABLE CARDIOMEGALY.  The osseous structures are unremarkable.  IMPRESSION: No acute cardiopulmonary disease.   Electronically Signed   By: Kathreen Devoid   On: 05/09/2013 16:06   Ct Head Wo Contrast  04/26/2013   CLINICAL DATA:  Headache weakness  EXAM: CT HEAD WITHOUT CONTRAST  TECHNIQUE: Contiguous axial images were obtained from the base of the skull through the vertex without intravenous contrast.  COMPARISON:  None.  FINDINGS:  Frontal sinus and multiple ethmoid air cells on the left side are opacified, and there is polypoid thickening of the mucosa of the left middle turbinate.  There is mild to moderate diffuse atrophy and there is mild low attenuation in the deep white matter. No evidence of vascular territory infarct. No hemorrhage or extra-axial fluid. No hydrocephalus or mass.  IMPRESSION: Left-sided paranasal sinus disease. Chronic age-related involutional change in the brain.   Electronically Signed   By: Skipper Cliche M.D.   On: 05/16/2013 17:43    Assessment/Plan Active Problems:   HTN (hypertension)   Bradycardia   AKI (acute kidney injury)   Hyponatremia   Bradycardia Likely due to combination of Catapres and Toprol in the setting of worsening renal function. We'll monitor the patient on telemetry, obtain cardiac enzymes time 3, cardiology consultation has been obtained by the ED physician.  Acute on chronic kidney disease Patient has worsening renal function, creatinine of 2.24, her baseline creatinine is around 1.6. We'll start the patient on IV normal saline at 75 mL per hour and check renal functions in the a.m.  Hypertension Patient has been on Catapres and Toprol XL for longtime, at this time she is experiencing rebound hypertension as these medications have been stopped. I will start patient  on amlodipine 10 mg daily along with hydralazine 25 every 6 hours. Will monitor the patient, when the heart rate starts to improve consider restarting Catapres at a dose of 0.1 twice a day  Diarrhea Resolved at this time, patient has not had BM this morning. Likely due to milk of magnesia. We'll continue to monitor  Hyponatremia Secondary to dehydration, will give IV fluids and follow labs the morning...  DVT prophylaxis Lovenox  Code status: Patient is DO NOT RESUSCITATE  Family discussion: discussed with patient's son at bedside   Time Spent on Admission: 57 min  [place]  [facility] Pager: [phone] 05/14/2013, 6:26 PM  If 7PM-7AM, please contact night-coverage  [email or web address]  Password [email or web address]

## 2013-05-19 NOTE — ED Notes (Signed)
Delay on troponin; lab called and was advised that sample will be run/was not "received" by lab staff

## 2013-05-19 NOTE — ED Notes (Signed)
Witnessed discussion between myself, Felipa Etherrence and son of patient at the bedside.  Son states he is her power of attorney and he does not believe the CT is medically necessary and wishes to wait for lab tests, other results prior to performing CT imaging.  Exam refused by patient's POA

## 2013-05-19 NOTE — ED Provider Notes (Addendum)
CSN: [id number]     Arrival date & time 05/13/2013  1320 History   First MD Initiated Contact with Patient 05/10/2013 1332     Chief Complaint  Patient presents with  . Bradycardia   (Consider location/radiation/quality/duration/timing/severity/associated sxs/prior Treatment) HPI Comments: 80 year old female presents with bradycardia. She is accompanied by her son who does most of the history. The patient apparently has her blood pressure and pulse checked every day. Normally her pulses at least 60 or higher. Since last night his been in the 40s and even down into the 30s. The patient has had exertional shortness of breath since last night as well. There is no shortness of breath at rest. There is no chest pain at any time. Numbness or dizziness. She's also had a gradual onset headache on the top of her head. There is no focal weakness or numbness. The patient takes clonidine and metoprolol for her blood pressure. She has not taken any extra doses of metoprolol. The clonidine has been increased about one month ago. The patient also been having problems with constipation and took a laxative 2 days ago. This resulted in large volume of diarrhea that she did not have a bowel movement today. Due to this she went to the urgent care when they saw her heart rate they referred her to the ER. She has not had any infectious symptoms such as fever, cough, or congestion. No urinary symptoms.   Past Medical History  Diagnosis Date  . Macular degeneration   . Hypertension   . Neuropathy   . Asthma   . Osteoporosis    No past surgical history on file. No family history on file. History  Substance Use Topics  . Smoking status: Former Smoker -- 0.50 packs/day    Types: Cigarettes    Quit date: 12/14/1976  . Smokeless tobacco: Not on file  . Alcohol Use: No   OB History   Grav Para Term Preterm Abortions TAB SAB Ect Mult Living                 Review of Systems  Constitutional: Negative for fever.   Respiratory: Positive for shortness of breath. Negative for cough.   Cardiovascular: Negative for chest pain and leg swelling.  Gastrointestinal: Positive for abdominal pain, diarrhea and constipation. Negative for nausea and vomiting.  Genitourinary: Negative for dysuria.  Musculoskeletal: Negative for back pain.  Neurological: Negative for weakness.  All other systems reviewed and are negative.    Allergies  Review of patient's allergies indicates no known allergies.  Home Medications   Current Outpatient Rx  Name  Route  Sig  Dispense  Refill  . beta carotene w/minerals (OCUVITE) tablet   Oral   Take 1 tablet by mouth daily.         . cholecalciferol (VITAMIN D) 1000 UNITS tablet   Oral   Take 2,000 Units by mouth daily.         . cloNIDine (CATAPRES) 0.1 MG tablet   Oral   Take 1 tablet (0.1 mg total) by mouth 3 (three) times daily.   90 tablet   3   . gabapentin (NEURONTIN) 100 MG capsule   Oral   Take 100 mg by mouth daily.          . metoprolol succinate (TOPROL-XL) 25 MG 24 hr tablet   Oral   Take 25 mg by mouth daily.         Marland Kitchen omeprazole (PRILOSEC) 20 MG capsule   Oral  Take 20 mg by mouth daily as needed (heartburn).         . Probiotic Product (ALIGN) 4 MG CAPS   Oral   Take 4 mg by mouth every morning.         . simvastatin (ZOCOR) 20 MG tablet   Oral   Take 10 mg by mouth 2 (two) times a week.           BP 167/42  Pulse 35  Temp(Src) 98.1 F (36.7 C) (Oral)  Resp 18  SpO2 95% Physical Exam  Nursing note and vitals reviewed. Constitutional: She is oriented to person, place, and time. She appears well-developed and well-nourished. No distress.  HENT:  Head: Normocephalic and atraumatic.  Right Ear: External ear normal.  Left Ear: External ear normal.  Nose: Nose normal.  Eyes: Right eye exhibits no discharge. Left eye exhibits no discharge.  Cardiovascular: Regular rhythm and normal heart sounds.  Bradycardia present.    Pulmonary/Chest: Effort normal and breath sounds normal.  Abdominal: Soft. She exhibits no distension. There is no tenderness.  Neurological: She is alert and oriented to person, place, and time.  Skin: Skin is warm and dry.    ED Course  Procedures (including critical care time) Labs Review Labs Reviewed  CBC WITH DIFFERENTIAL - Abnormal; Notable for the following:    WBC 11.4 (*)    RBC 2.91 (*)    Hemoglobin 8.3 (*)    HCT 24.8 (*)    RDW 16.4 (*)    Monocytes Absolute 1.2 (*)    All other components within normal limits  COMPREHENSIVE METABOLIC PANEL - Abnormal; Notable for the following:    Sodium 129 (*)    Chloride 92 (*)    Glucose, Bld 104 (*)    BUN 42 (*)    Creatinine, Ser 2.24 (*)    Albumin 3.0 (*)    GFR calc non Af Amer 17 (*)    GFR calc Af Amer 20 (*)    All other components within normal limits  URINALYSIS, ROUTINE W REFLEX MICROSCOPIC - Abnormal; Notable for the following:    Protein, ur 30 (*)    Leukocytes, UA TRACE (*)    All other components within normal limits  MAGNESIUM - Abnormal; Notable for the following:    Magnesium 2.6 (*)    All other components within normal limits  URINE MICROSCOPIC-ADD ON - Abnormal; Notable for the following:    Squamous Epithelial / LPF MANY (*)    All other components within normal limits  TROPONIN I   Imaging Review Dg Chest 2 View  05/03/2013   CLINICAL DATA:  FATIGUE, WEAKNESS  EXAM: CHEST  2 VIEW  COMPARISON:  DG CHEST 2V dated 09/13/2012  FINDINGS: There is no focal parenchymal opacity, pleural effusion, or pneumothorax. THERE IS STABLE CARDIOMEGALY.  The osseous structures are unremarkable.  IMPRESSION: No acute cardiopulmonary disease.   Electronically Signed   By: Elige KoHetal  Patel   On: 05/02/2013 16:06   Ct Head Wo Contrast  05/16/2013   CLINICAL DATA:  Headache weakness  EXAM: CT HEAD WITHOUT CONTRAST  TECHNIQUE: Contiguous axial images were obtained from the base of the skull through the vertex without  intravenous contrast.  COMPARISON:  None.  FINDINGS: Frontal sinus and multiple ethmoid air cells on the left side are opacified, and there is polypoid thickening of the mucosa of the left middle turbinate.  There is mild to moderate diffuse atrophy and there is mild low attenuation  in the deep white matter. No evidence of vascular territory infarct. No hemorrhage or extra-axial fluid. No hydrocephalus or mass.  IMPRESSION: Left-sided paranasal sinus disease. Chronic age-related involutional change in the brain.   Electronically Signed   By: Esperanza Heir M.D.   On: 05/14/2013 17:43    EKG Interpretation    Date/Time:  Saturday May 19 2013 13:21:31 EST Ventricular Rate:  34 PR Interval:  196 QRS Duration: 155 QT Interval:  570 QTC Calculation: 429 R Axis:   63 Text Interpretation:  Sinus bradycardia Right bundle branch block No old tracing to compare Confirmed by Izela Altier  MD, Maribeth Jiles ([id number]) on 04/27/2013 1:31:57 PM            MDM   1. Bradycardia    Patient is well-appearing here and does not seem to have any acute symptoms of her bradycardia. This does seem to be an acute change since last night. On the EKG it is hard to see P waves but there are visible. On closer inspection there may be P waves that are not followed by a ventricular beat in between each normal beat. There is no evidence of lengthening so this is a [place] if not sinus bradycardia. She does have concerning exertional shortness of breath. Due to this she will need cardiology consult (Dr. Verdie Mosher will see) and medicine admission. CT of her head is normal with no focal neuro signs at low suspicion for acute intracranial pathology. She has a mild hyponatremia which will also need to be followed. Mild renal insufficiency is of uncertain significance. Could be caused by her recent increase in stools after using laxatives.    Audree Camel, MD 04/28/2013 1756  Audree Camel, MD 05/03/2013 1800

## 2013-05-20 ENCOUNTER — Inpatient Hospital Stay (HOSPITAL_COMMUNITY): Payer: Medicare Other

## 2013-05-20 DIAGNOSIS — I441 Atrioventricular block, second degree: Secondary | ICD-10-CM

## 2013-05-20 DIAGNOSIS — I350 Nonrheumatic aortic (valve) stenosis: Secondary | ICD-10-CM | POA: Diagnosis present

## 2013-05-20 DIAGNOSIS — K819 Cholecystitis, unspecified: Secondary | ICD-10-CM

## 2013-05-20 DIAGNOSIS — K8020 Calculus of gallbladder without cholecystitis without obstruction: Secondary | ICD-10-CM

## 2013-05-20 DIAGNOSIS — I359 Nonrheumatic aortic valve disorder, unspecified: Secondary | ICD-10-CM

## 2013-05-20 LAB — COMPREHENSIVE METABOLIC PANEL
ALBUMIN: 2.7 g/dL — AB (ref 3.5–5.2)
ALT: 34 U/L (ref 0–35)
AST: 47 U/L — AB (ref 0–37)
Alkaline Phosphatase: 225 U/L — ABNORMAL HIGH (ref 39–117)
BUN: 39 mg/dL — ABNORMAL HIGH (ref 6–23)
CALCIUM: 8.8 mg/dL (ref 8.4–10.5)
CO2: 21 mEq/L (ref 19–32)
CREATININE: 2.03 mg/dL — AB (ref 0.50–1.10)
Chloride: 97 mEq/L (ref 96–112)
GFR calc Af Amer: 22 mL/min — ABNORMAL LOW (ref >90)
GFR calc non Af Amer: 19 mL/min — ABNORMAL LOW (ref >90)
Glucose, Bld: 89 mg/dL (ref 70–99)
Potassium: 4.1 mEq/L (ref 3.7–5.3)
Sodium: 135 mEq/L — ABNORMAL LOW (ref 137–147)
Total Bilirubin: 0.4 mg/dL (ref 0.3–1.2)
Total Protein: 6.2 g/dL (ref 6.0–8.3)

## 2013-05-20 LAB — CBC
HCT: 24.5 % — ABNORMAL LOW (ref 36.0–46.0)
Hemoglobin: 8.4 g/dL — ABNORMAL LOW (ref 12.0–15.0)
MCH: 29.1 pg (ref 26.0–34.0)
MCHC: 34.3 g/dL (ref 30.0–36.0)
MCV: 84.8 fL (ref 78.0–100.0)
PLATELETS: 305 10*3/uL (ref 150–400)
RBC: 2.89 MIL/uL — ABNORMAL LOW (ref 3.87–5.11)
RDW: 16.4 % — ABNORMAL HIGH (ref 11.5–15.5)
WBC: 10.7 10*3/uL — AB (ref 4.0–10.5)

## 2013-05-20 LAB — TROPONIN I: Troponin I: <0.3 ng/mL (ref <0.30)

## 2013-05-20 MED ORDER — CIPROFLOXACIN IN D5W 400 MG/200ML IV SOLN
400.0000 mg | Freq: Two times a day (BID) | INTRAVENOUS | Status: DC
  Administered 2013-05-20 – 2013-05-24 (×8): 400 mg via INTRAVENOUS
  Filled 2013-05-20 (×9): qty 200

## 2013-05-20 MED ORDER — MORPHINE SULFATE 2 MG/ML IJ SOLN
2.0000 mg | INTRAMUSCULAR | Status: DC | PRN
  Administered 2013-05-20 (×2): 2 mg via INTRAVENOUS
  Filled 2013-05-20 (×3): qty 1

## 2013-05-20 MED ORDER — MORPHINE SULFATE 2 MG/ML IJ SOLN
2.0000 mg | INTRAMUSCULAR | Status: DC | PRN
  Administered 2013-05-20 – 2013-05-24 (×5): 2 mg via INTRAVENOUS
  Filled 2013-05-20 (×4): qty 1

## 2013-05-20 MED ORDER — HYDRALAZINE HCL 50 MG PO TABS
50.0000 mg | ORAL_TABLET | Freq: Four times a day (QID) | ORAL | Status: DC
  Administered 2013-05-20 (×2): 50 mg via ORAL
  Filled 2013-05-20 (×7): qty 1

## 2013-05-20 MED ORDER — WHITE PETROLATUM GEL
Status: AC
  Administered 2013-05-20
  Filled 2013-05-20: qty 5

## 2013-05-20 NOTE — Progress Notes (Signed)
[facility] PROGRESS NOTE  Katrina Beck YWV:[id number] DOB: November 14, 1912 DOA: 05/17/2013 PCP: Kennon Portela, MD  Assessment/Plan: 1. Abdominal pain- ? Cause, mild elevation of Alk phos, AST is 47. Will keep her npo, will obtain abdominal ultrasound to r/o cholecystitis, also start morphine prn for pain. 2. Bradycardia- HR improved after stopping catapres and Metoprolol, continue to monitor. Cardiology following. 3. Hypertension- BP stable, continue amlodipine, hydralazine 25 mg po q 6 hrs. 4. Acute on chronic kidney disease- Cr now coming back to baseline. 5. Hyponatremia- Resolved, likely due to dehydration. 6. DVT Prophylaxis- Lovenox   Code Status: DNR Family Communication: *Discussed with daughter at bedside Disposition Plan: Home when stable   Consultants:  Cardiology  Procedures:  None  Antibiotics:  None  HPI/Subjective: Patient seen and examined, HR has improved after stopping Catapres and Metoprolol. BP is well controlled with amlodipine and Hydralazine.  Today complains of abdominal pain, no nausea or vomiting. Alk phos now elevated to 225.  Objective: Filed Vitals:   05/20/13 0735  BP: 163/44  Pulse: 34  Temp: 98.5 F (36.9 C)  Resp: 22    Intake/Output Summary (Last 24 hours) at 05/20/13 0813 Last data filed at 05/20/13 0600  Gross per 24 hour  Intake    815 ml  Output    650 ml  Net    165 ml   Filed Weights   04/30/2013 1901  Weight: 64.2 kg (141 lb 8.6 oz)    Exam:  Physical Exam: Head: Normocephalic, atraumatic.  Eyes: No signs of jaundice, EOMI Nose: Mucous membranes dry.  Throat: Oropharynx nonerythematous, no exudate appreciated.  Neck: supple,No deformities, masses, or tenderness noted. Lungs: Normal respiratory effort. B/L Clear to auscultation, no crackles or wheezes.  Heart: Regular RR. S1 and S2 normal  Abdomen: BS normoactive. Soft, positive RUQ tenderness to palpation Extremities: No pretibial edema, no  erythema   Data Reviewed: Basic Metabolic Panel:  Recent Labs Lab 04/28/2013 1536 05/20/13 0340  NA 129* 135*  K 4.6 4.1  CL 92* 97  CO2 23 21  GLUCOSE 104* 89  BUN 42* 39*  CREATININE 2.24* 2.03*  CALCIUM 8.9 8.8  MG 2.6*  --    Liver Function Tests:  Recent Labs Lab 05/09/2013 1536 05/20/13 0340  AST 18 47*  ALT 18 34  ALKPHOS 94 225*  BILITOT 0.4 0.4  PROT 6.6 6.2  ALBUMIN 3.0* 2.7*   No results found for this basename: LIPASE, AMYLASE,  in the last 168 hours No results found for this basename: AMMONIA,  in the last 168 hours CBC:  Recent Labs Lab 05/02/2013 1536 05/20/13 0340  WBC 11.4* 10.7*  NEUTROABS 7.6  --   HGB 8.3* 8.4*  HCT 24.8* 24.5*  MCV 85.2 84.8  PLT 301 305   Cardiac Enzymes:  Recent Labs Lab 04/24/2013 1536 05/14/2013 2045 05/20/13 0340  TROPONINI <0.30 <0.30 <0.30   BNP (last 3 results) No results found for this basename: PROBNP,  in the last 8760 hours CBG: No results found for this basename: GLUCAP,  in the last 168 hours  Recent Results (from the past 240 hour(s))  MRSA PCR SCREENING     Status: None   Collection Time    04/22/2013  8:28 PM      Result Value Range Status   MRSA by PCR NEGATIVE  NEGATIVE Final   Comment:            The GeneXpert MRSA Assay (FDA     approved for  NASAL specimens     only), is one component of a     comprehensive MRSA colonization     surveillance program. It is not     intended to diagnose MRSA     infection nor to guide or     monitor treatment for     MRSA infections.     Studies: Dg Chest 2 View  05/01/2013   CLINICAL DATA:  FATIGUE, WEAKNESS  EXAM: CHEST  2 VIEW  COMPARISON:  DG CHEST 2V dated 09/13/2012  FINDINGS: There is no focal parenchymal opacity, pleural effusion, or pneumothorax. THERE IS STABLE CARDIOMEGALY.  The osseous structures are unremarkable.  IMPRESSION: No acute cardiopulmonary disease.   Electronically Signed   By: Kathreen Devoid   On: 05/12/2013 16:06   Ct Head Wo  Contrast  04/28/2013   CLINICAL DATA:  Headache weakness  EXAM: CT HEAD WITHOUT CONTRAST  TECHNIQUE: Contiguous axial images were obtained from the base of the skull through the vertex without intravenous contrast.  COMPARISON:  None.  FINDINGS: Frontal sinus and multiple ethmoid air cells on the left side are opacified, and there is polypoid thickening of the mucosa of the left middle turbinate.  There is mild to moderate diffuse atrophy and there is mild low attenuation in the deep white matter. No evidence of vascular territory infarct. No hemorrhage or extra-axial fluid. No hydrocephalus or mass.  IMPRESSION: Left-sided paranasal sinus disease. Chronic age-related involutional change in the brain.   Electronically Signed   By: Skipper Cliche M.D.   On: 05/09/2013 17:43    Scheduled Meds: . amLODipine  10 mg Oral Daily  . enoxaparin (LOVENOX) injection  30 mg Subcutaneous Q24H  . gabapentin  100 mg Oral Daily  . hydrALAZINE  25 mg Oral Q6H  . [START ON 05/21/2013] simvastatin  10 mg Oral Custom   Continuous Infusions: . sodium chloride 75 mL/hr at 05/04/2013 2044    Active Problems:   HTN (hypertension)   Bradycardia   AKI (acute kidney injury)   Hyponatremia    Time spent: 25 min    [facility]  [facility] Pager [phone]*. If 7PM-7AM, please contact night-coverage at [email or web address], password [facility] 05/20/2013, 8:13 AM  LOS: 1 day

## 2013-05-20 NOTE — Consult Note (Signed)
[facility] Consultation Note  Referring Provider:  Dr. Mauro Kaufmann [facility]) Primary Care Physician:  Tally Due, MD  Reason for Consultation:  Abdominal pain, elevated LFTs  HPI: Katrina Beck is a 80 y.o. female admitted for symptomatic bradycardia.  We were asked to see for abdominal pain. For the past 4-6 weeks, patient has had progressive generalized abdominal discomfort. Pain seems worse in the immediate time after she urinates.  Eating, at least of late, has somewhat increased her abdominal pain as well.  No nausea, vomiting, blood in stool, fevers.  Recent LFTs with slight elevation of ALP and AST, normal bilirubin.  Ultrasound showed gallbladder distention with gallstone, and biliary ductal dilatation.   Past Medical History  Diagnosis Date  . Macular degeneration   . Hypertension   . Neuropathy   . Asthma   . Osteoporosis     No past surgical history on file.  Prior to Admission medications   Medication Sig Start Date End Date Taking? Authorizing Provider  beta carotene w/minerals (OCUVITE) tablet Take 1 tablet by mouth daily.   Yes Historical Provider, MD  cholecalciferol (VITAMIN D) 1000 UNITS tablet Take 2,000 Units by mouth daily.   Yes Historical Provider, MD  cloNIDine (CATAPRES) 0.1 MG tablet Take 1 tablet (0.1 mg total) by mouth 3 (three) times daily. 04/09/13  Yes [name], MD  gabapentin (NEURONTIN) 100 MG capsule Take 100 mg by mouth daily.    Yes Historical Provider, MD  metoprolol succinate (TOPROL-XL) 25 MG 24 hr tablet Take 25 mg by mouth daily.   Yes Historical Provider, MD  omeprazole (PRILOSEC) 20 MG capsule Take 20 mg by mouth daily as needed (heartburn).   Yes Historical Provider, MD  Probiotic Product (ALIGN) 4 MG CAPS Take 4 mg by mouth every morning.   Yes Historical Provider, MD  simvastatin (ZOCOR) 20 MG tablet Take 10 mg by mouth 2 (two) times a week.    Yes Historical Provider, MD    Current Facility-Administered Medications   Medication Dose Route Frequency Provider Last Rate Last Dose  . 0.9 %  sodium chloride infusion   Intravenous Continuous Meredeth Ide, MD 75 mL/hr at 05-29-2013 2044    . acetaminophen (TYLENOL) tablet 650 mg  650 mg Oral Q6H PRN Meredeth Ide, MD   650 mg at 05/20/13 0404   Or  . acetaminophen (TYLENOL) suppository 650 mg  650 mg Rectal Q6H PRN Meredeth Ide, MD      . enoxaparin (LOVENOX) injection 30 mg  30 mg Subcutaneous Q24H Meredeth Ide, MD   30 mg at 05-29-13 2148  . gabapentin (NEURONTIN) capsule 100 mg  100 mg Oral Daily Meredeth Ide, MD   100 mg at 05/20/13 1610  . hydrALAZINE (APRESOLINE) injection 10 mg  10 mg Intravenous Q4H PRN Haydee Salter, MD      . hydrALAZINE (APRESOLINE) tablet 50 mg  50 mg Oral Q6H Dolores Patty, MD      . morphine 2 MG/ML injection 2 mg  2 mg Intravenous Q4H PRN Meredeth Ide, MD   2 mg at 05/20/13 1241  . [START ON 05/21/2013] simvastatin (ZOCOR) tablet 10 mg  10 mg Oral Custom Meredeth Ide, MD        Allergies as of May 29, 2013  . (No Known Allergies)    No family history on file.  History   Social History  . Marital Status: Unknown    Spouse Name: N/A    Number  of Children: N/A  . Years of Education: N/A   Occupational History  . Not on file.   Social History Main Topics  . Smoking status: Former Smoker -- 0.50 packs/day    Types: Cigarettes    Quit date: 12/14/1976  . Smokeless tobacco: Not on file  . Alcohol Use: No  . Drug Use: No  . Sexual Activity: Not on file   Other Topics Concern  . Not on file   Social History Narrative  . No narrative on file    Review of Systems: ROS Dr. Sharl MaLama 04/29/2013 reviewed and I agree  Physical Exam: Vital signs in last 24 hours: Temp:  [97.4 F (36.3 C)-98.5 F (36.9 C)] 98.3 F (36.8 C) (02/01 1139) Pulse Rate:  [30-96] 39 (02/01 1139) Resp:  [11-22] 15 (02/01 1139) BP: (148-207)/(30-70) 165/37 mmHg (02/01 1139) SpO2:  [77 %-100 %] 94 % (02/01 1139) Weight:  [64.2 kg (141 lb 8.6 oz)] 64.2 kg  (141 lb 8.6 oz) (01/31 1901) Last BM Date: 05/12/2013 General:   Alert,  Well-developed, well-nourished, pleasant and cooperative in NAD  Much younger-appearing than stated age Head:  Normocephalic and atraumatic. Eyes:  Sclera clear, no icterus.   Conjunctival erythema diffusely bilaterally Ears:  Normal auditory acuity. Nose:  No deformity, discharge,  or lesions. Mouth:  No deformity or lesions.  Oropharynx pink & moist. Neck:  Supple; no masses or thyromegaly. Lungs:  Clear throughout to auscultation.   No wheezes, crackles, or rhonchi. No acute distress. Heart:  Mild bradycardic rate and rhythm; no murmurs, clicks, rubs,  or gallops. Abdomen:  Soft,mild distended, right upper and right mid tenderness, increases with inspiration; mild voluntary guarding; no peritonitis. No masses, hepatosplenomegaly or hernias noted.   Slightly hyperactive bowel sounds Msk:  Symmetrical without gross deformities. Normal posture. Pulses:  Normal pulses noted. Extremities:  Without clubbing or edema. Neurologic:  Alert and  oriented x4;  Diffusely weak, otherwise grossly normal neurologically. Skin:  Intact without significant lesions or rashes. Cervical Nodes:  No significant cervical adenopathy. Psych:  Alert and cooperative. Normal mood and affect.   Lab Results:  Recent Labs  05/07/2013 1536 05/20/13 0340  WBC 11.4* 10.7*  HGB 8.3* 8.4*  HCT 24.8* 24.5*  PLT 301 305   BMET  Recent Labs  05/06/2013 1536 05/20/13 0340  NA 129* 135*  K 4.6 4.1  CL 92* 97  CO2 23 21  GLUCOSE 104* 89  BUN 42* 39*  CREATININE 2.24* 2.03*  CALCIUM 8.9 8.8   LFT  Recent Labs  05/20/13 0340  PROT 6.2  ALBUMIN 2.7*  AST 47*  ALT 34  ALKPHOS 225*  BILITOT 0.4   PT/INR No results found for this basename: LABPROT, INR,  in the last 72 hours  Studies/Results: Dg Chest 2 View  05/04/2013   CLINICAL DATA:  FATIGUE, WEAKNESS  EXAM: CHEST  2 VIEW  COMPARISON:  DG CHEST 2V dated 09/13/2012  FINDINGS: There  is no focal parenchymal opacity, pleural effusion, or pneumothorax. THERE IS STABLE CARDIOMEGALY.  The osseous structures are unremarkable.  IMPRESSION: No acute cardiopulmonary disease.   Electronically Signed   By: Elige KoHetal  Patel   On: 05/02/2013 16:06   Ct Head Wo Contrast  04/24/2013   CLINICAL DATA:  Headache weakness  EXAM: CT HEAD WITHOUT CONTRAST  TECHNIQUE: Contiguous axial images were obtained from the base of the skull through the vertex without intravenous contrast.  COMPARISON:  None.  FINDINGS: Frontal sinus and multiple ethmoid air cells  on the left side are opacified, and there is polypoid thickening of the mucosa of the left middle turbinate.  There is mild to moderate diffuse atrophy and there is mild low attenuation in the deep white matter. No evidence of vascular territory infarct. No hemorrhage or extra-axial fluid. No hydrocephalus or mass.  IMPRESSION: Left-sided paranasal sinus disease. Chronic age-related involutional change in the brain.   Electronically Signed   By: Esperanza Heir M.D.   On: 05/05/2013 17:43   US Abdomen Complete  05/20/2013   CLINICAL DATA:  Abdominal pain  EXAM: COMPLETE ABDOMINAL ULTRASOUND  COMPARISON:  None available  FINDINGS: Gallbladder: Distended. No gallbladder wall thickening. There is a single 7 mm stone in the dependent aspect. No pericholecystic fluid. Sonographer reports no sonographic Murphy's sign.  Common bile duct:  Dilated common 12 mm diameter.  Liver: Suspect mild intrahepatic biliary ductal dilatation. No focal lesion.  IVC:  Negative  Pancreas:  Negative  Spleen:  No focal lesion, craniocaudal 4.2cm in length.  Right Kidney: Echogenic parenchyma. Multiple cysts, largest in the upper pole 25 x 29 x 30 mm. No hydronephrosis.  Left Kidney: Not identified. Patient relates history of nephrectomy.  Abdominal aorta:  Atheromatous without evidence of aneurysm  IMPRESSION: 1. Cholelithiasis with distended gallbladder and mild intrahepatic biliary ductal  dilatation suggesting biliary obstruction. 2. Multiple right renal cysts on a background of echogenic parenchyma, a nonspecific indicator suggesting medical renal disease. 3. Non visualization of left kidney consistent with history nephrectomy.   Electronically Signed   By: Oley Balm M.D.   On: 05/20/2013 10:13   Impression:  1.  Abdominal pain, right-sided predominant.  Suspect cholecystitis. 2.  Abnormal Ultrasound, gallbladder distention and gallstones. 3.  Biliary ductal dilatation. 4.  Elevated LFTs, ALP 200s, mildly elevated AST, bilirubin normal. 5.  Bradycardia.  Plan:  1.  Surgical consult in process; patient would likely benefit from HIDA scan to evaluate for cystic duct obstruction. 2.  If cystic duct obstruction is seen, would likely best benefit from percutaneous drainage in the acute setting, but will defer that ultimate decision to surgical consultants. 3.  No need for ERCP at the present time; would follow serial LFTs.  If concerns for impending biliary obstruction persist, could consider MRCP. 4.  Bradycardia management per cardiology and primary teams. 5.  Case discussed in detail with patient, her son, as well as Dr. Luisa Hart with [facility]. 6.  Will Follow.  30 minutes spent in patient counseling and coordination of care.   LOS: 1 day   Derya Dettmann M  05/20/2013, 2:12 PM

## 2013-05-20 NOTE — Consult Note (Signed)
Reason for Consult:gallbladder disease Referring Physician: Eleonore Chiquito MD  Katrina Beck is an 80 y.o. female.  HPI: Asked to se pt at the request of  Dr Darrick Meigs for RUQ pain and nausea.  Admitted for bradycardia and today developed increasing RUQ abdominal pain severe and sharp.  Made worse when pressed.  U/S shows GS and CBD 1.2 cm. Slight increase in alk phos.  Pain now constant.  No GB wall thickening or fluid. Seen by cardiology.History of nausea and mild intermittent RUQ pain on and off for months.  Does occur with eating.   Past Medical History  Diagnosis Date  . Macular degeneration   . Hypertension   . Neuropathy   . Asthma   . Osteoporosis     No past surgical history on file.  No family history on file.  Social History:  reports that she quit smoking about 36 years ago. Her smoking use included Cigarettes. She smoked 0.50 packs per day. She does not have any smokeless tobacco history on file. She reports that she does not drink alcohol or use illicit drugs.  Allergies: No Known Allergies  Medications: I have reviewed the patient's current medications.  Results for orders placed during the hospital encounter of 05/16/2013 (from the past 48 hour(s))  URINALYSIS, ROUTINE W REFLEX MICROSCOPIC     Status: Abnormal   Collection Time    05/09/2013  3:09 PM      Result Value Range   Color, Urine YELLOW  YELLOW   APPearance CLEAR  CLEAR   Specific Gravity, Urine 1.012  1.005 - 1.030   pH 5.0  5.0 - 8.0   Glucose, UA NEGATIVE  NEGATIVE mg/dL   Hgb urine dipstick NEGATIVE  NEGATIVE   Bilirubin Urine NEGATIVE  NEGATIVE   Ketones, ur NEGATIVE  NEGATIVE mg/dL   Protein, ur 30 (*) NEGATIVE mg/dL   Urobilinogen, UA 0.2  0.0 - 1.0 mg/dL   Nitrite NEGATIVE  NEGATIVE   Leukocytes, UA TRACE (*) NEGATIVE  URINE MICROSCOPIC-ADD ON     Status: Abnormal   Collection Time    04/26/2013  3:09 PM      Result Value Range   Squamous Epithelial / LPF MANY (*) RARE   WBC, UA 0-2  <3 WBC/hpf    Bacteria, UA RARE  RARE  CBC WITH DIFFERENTIAL     Status: Abnormal   Collection Time    05/09/2013  3:36 PM      Result Value Range   WBC 11.4 (*) 4.0 - 10.5 K/uL   RBC 2.91 (*) 3.87 - 5.11 MIL/uL   Hemoglobin 8.3 (*) 12.0 - 15.0 g/dL   HCT 24.8 (*) 36.0 - 46.0 %   MCV 85.2  78.0 - 100.0 fL   MCH 28.5  26.0 - 34.0 pg   MCHC 33.5  30.0 - 36.0 g/dL   RDW 16.4 (*) 11.5 - 15.5 %   Platelets 301  150 - 400 K/uL   Neutrophils Relative % 67  43 - 77 %   Neutro Abs 7.6  1.7 - 7.7 K/uL   Lymphocytes Relative 23  12 - 46 %   Lymphs Abs 2.6  0.7 - 4.0 K/uL   Monocytes Relative 10  3 - 12 %   Monocytes Absolute 1.2 (*) 0.1 - 1.0 K/uL   Eosinophils Relative 0  0 - 5 %   Eosinophils Absolute 0.0  0.0 - 0.7 K/uL   Basophils Relative 0  0 - 1 %   Basophils  Absolute 0.0  0.0 - 0.1 K/uL  COMPREHENSIVE METABOLIC PANEL     Status: Abnormal   Collection Time    05/01/2013  3:36 PM      Result Value Range   Sodium 129 (*) 137 - 147 mEq/L   Potassium 4.6  3.7 - 5.3 mEq/L   Chloride 92 (*) 96 - 112 mEq/L   CO2 23  19 - 32 mEq/L   Glucose, Bld 104 (*) 70 - 99 mg/dL   BUN 42 (*) 6 - 23 mg/dL   Creatinine, Ser 2.24 (*) 0.50 - 1.10 mg/dL   Calcium 8.9  8.4 - 10.5 mg/dL   Total Protein 6.6  6.0 - 8.3 g/dL   Albumin 3.0 (*) 3.5 - 5.2 g/dL   AST 18  0 - 37 U/L   ALT 18  0 - 35 U/L   Alkaline Phosphatase 94  39 - 117 U/L   Total Bilirubin 0.4  0.3 - 1.2 mg/dL   GFR calc non Af Amer 17 (*) >90 mL/min   GFR calc Af Amer 20 (*) >90 mL/min   Comment: (NOTE)     The eGFR has been calculated using the CKD EPI equation.     This calculation has not been validated in all clinical situations.     eGFR's persistently <90 mL/min signify possible Chronic Kidney     Disease.  TROPONIN I     Status: None   Collection Time    04/24/2013  3:36 PM      Result Value Range   Troponin I <0.30  <0.30 ng/mL   Comment:            Due to the release kinetics of cTnI,     a negative result within the first hours     of  the onset of symptoms does not rule out     myocardial infarction with certainty.     If myocardial infarction is still suspected,     repeat the test at appropriate intervals.  MAGNESIUM     Status: Abnormal   Collection Time    05/12/2013  3:36 PM      Result Value Range   Magnesium 2.6 (*) 1.5 - 2.5 mg/dL  MRSA PCR SCREENING     Status: None   Collection Time    05/11/2013  8:28 PM      Result Value Range   MRSA by PCR NEGATIVE  NEGATIVE   Comment:            The GeneXpert MRSA Assay (FDA     approved for NASAL specimens     only), is one component of a     comprehensive MRSA colonization     surveillance program. It is not     intended to diagnose MRSA     infection nor to guide or     monitor treatment for     MRSA infections.  TROPONIN I     Status: None   Collection Time    05/16/2013  8:45 PM      Result Value Range   Troponin I <0.30  <0.30 ng/mL   Comment:            Due to the release kinetics of cTnI,     a negative result within the first hours     of the onset of symptoms does not rule out     myocardial infarction with certainty.     If myocardial infarction is  still suspected,     repeat the test at appropriate intervals.  TROPONIN I     Status: None   Collection Time    05/20/13  3:40 AM      Result Value Range   Troponin I <0.30  <0.30 ng/mL   Comment:            Due to the release kinetics of cTnI,     a negative result within the first hours     of the onset of symptoms does not rule out     myocardial infarction with certainty.     If myocardial infarction is still suspected,     repeat the test at appropriate intervals.  CBC     Status: Abnormal   Collection Time    05/20/13  3:40 AM      Result Value Range   WBC 10.7 (*) 4.0 - 10.5 K/uL   RBC 2.89 (*) 3.87 - 5.11 MIL/uL   Hemoglobin 8.4 (*) 12.0 - 15.0 g/dL   HCT 24.5 (*) 36.0 - 46.0 %   MCV 84.8  78.0 - 100.0 fL   MCH 29.1  26.0 - 34.0 pg   MCHC 34.3  30.0 - 36.0 g/dL   RDW 16.4 (*) 11.5 -  15.5 %   Platelets 305  150 - 400 K/uL  COMPREHENSIVE METABOLIC PANEL     Status: Abnormal   Collection Time    05/20/13  3:40 AM      Result Value Range   Sodium 135 (*) 137 - 147 mEq/L   Potassium 4.1  3.7 - 5.3 mEq/L   Chloride 97  96 - 112 mEq/L   CO2 21  19 - 32 mEq/L   Glucose, Bld 89  70 - 99 mg/dL   BUN 39 (*) 6 - 23 mg/dL   Creatinine, Ser 2.03 (*) 0.50 - 1.10 mg/dL   Calcium 8.8  8.4 - 10.5 mg/dL   Total Protein 6.2  6.0 - 8.3 g/dL   Albumin 2.7 (*) 3.5 - 5.2 g/dL   AST 47 (*) 0 - 37 U/L   ALT 34  0 - 35 U/L   Alkaline Phosphatase 225 (*) 39 - 117 U/L   Total Bilirubin 0.4  0.3 - 1.2 mg/dL   GFR calc non Af Amer 19 (*) >90 mL/min   GFR calc Af Amer 22 (*) >90 mL/min   Comment: (NOTE)     The eGFR has been calculated using the CKD EPI equation.     This calculation has not been validated in all clinical situations.     eGFR's persistently <90 mL/min signify possible Chronic Kidney     Disease.    Dg Chest 2 View  05/08/2013   CLINICAL DATA:  FATIGUE, WEAKNESS  EXAM: CHEST  2 VIEW  COMPARISON:  DG CHEST 2V dated 09/13/2012  FINDINGS: There is no focal parenchymal opacity, pleural effusion, or pneumothorax. THERE IS STABLE CARDIOMEGALY.  The osseous structures are unremarkable.  IMPRESSION: No acute cardiopulmonary disease.   Electronically Signed   By: Kathreen Devoid   On: 05/02/2013 16:06   Ct Head Wo Contrast  04/22/2013   CLINICAL DATA:  Headache weakness  EXAM: CT HEAD WITHOUT CONTRAST  TECHNIQUE: Contiguous axial images were obtained from the base of the skull through the vertex without intravenous contrast.  COMPARISON:  None.  FINDINGS: Frontal sinus and multiple ethmoid air cells on the left side are opacified, and there is polypoid thickening of the mucosa  of the left middle turbinate.  There is mild to moderate diffuse atrophy and there is mild low attenuation in the deep white matter. No evidence of vascular territory infarct. No hemorrhage or extra-axial fluid. No  hydrocephalus or mass.  IMPRESSION: Left-sided paranasal sinus disease. Chronic age-related involutional change in the brain.   Electronically Signed   By: Skipper Cliche M.D.   On: 05/08/2013 17:43   US Abdomen Complete  05/20/2013   CLINICAL DATA:  Abdominal pain  EXAM: COMPLETE ABDOMINAL ULTRASOUND  COMPARISON:  None available  FINDINGS: Gallbladder: Distended. No gallbladder wall thickening. There is a single 7 mm stone in the dependent aspect. No pericholecystic fluid. Sonographer reports no sonographic Murphy's sign.  Common bile duct:  Dilated common 12 mm diameter.  Liver: Suspect mild intrahepatic biliary ductal dilatation. No focal lesion.  IVC:  Negative  Pancreas:  Negative  Spleen:  No focal lesion, craniocaudal 4.2cm in length.  Right Kidney: Echogenic parenchyma. Multiple cysts, largest in the upper pole 25 x 29 x 30 mm. No hydronephrosis.  Left Kidney: Not identified. Patient relates history of nephrectomy.  Abdominal aorta:  Atheromatous without evidence of aneurysm  IMPRESSION: 1. Cholelithiasis with distended gallbladder and mild intrahepatic biliary ductal dilatation suggesting biliary obstruction. 2. Multiple right renal cysts on a background of echogenic parenchyma, a nonspecific indicator suggesting medical renal disease. 3. Non visualization of left kidney consistent with history nephrectomy.   Electronically Signed   By: Arne Cleveland M.D.   On: 05/20/2013 10:13    Review of Systems  Constitutional: Negative.   HENT: Negative.   Eyes: Negative.   Cardiovascular: Positive for orthopnea.  Gastrointestinal: Positive for nausea and abdominal pain.  Genitourinary: Negative.   Musculoskeletal: Negative.   Skin: Negative.   Neurological: Positive for dizziness.  Endo/Heme/Allergies: Negative.   Psychiatric/Behavioral: Negative.    Blood pressure 165/37, pulse 39, temperature 98.3 F (36.8 C), temperature source Oral, resp. rate 15, weight 141 lb 8.6 oz (64.2 kg), SpO2  94.00%. Physical Exam  Constitutional: She is oriented to person, place, and time. She appears well-developed and well-nourished.  HENT:  Head: Normocephalic and atraumatic.  Eyes: No scleral icterus.  Neck: Normal range of motion. Neck supple.  Respiratory: Effort normal and breath sounds normal.  GI: She exhibits no distension and no mass. There is tenderness in the right upper quadrant.  Musculoskeletal: Normal range of motion.  Neurological: She is alert and oriented to person, place, and time.  Skin: Skin is warm and dry.  Psychiatric: She has a normal mood and affect. Her behavior is normal. Judgment and thought content normal.    Assessment/Plan: Gallbladder disease / stones and acute cholecystitis and dilated CBD.   GI at bedside. Given  cardiac issues and age, check HIDA.  If positive,  will need perc drain.  Start antibiotics and keep NPO except  today except ice.  Will follow up in am.   Patient Active Problem List   Diagnosis Date Noted  . Heart block AV second degree 05/20/2013  . Aortic stenosis 05/20/2013  . Bradycardia 05/10/2013  . AKI (acute kidney injury) 04/29/2013  . Hyponatremia 05/13/2013  . HTN (hypertension) 12/15/2011    Arbor Leer A. 05/20/2013, 3:15 PM

## 2013-05-20 NOTE — Progress Notes (Addendum)
Subjective:   80 y/o woman with HTN, CKD admitted with dyspnea and renal failure. In ER episodes of 2:1 AVB with HR in 30s. Metoprolol and clonidine held.   Complains of abdominal pain. Remains in 2:1 HB. V-rate 40s. Denies dyspnea but RR in mid 20s.   Ab u/s: Cholelithiasis with distended gallbladder and mild intrahepatic  biliary ductal dilatation suggesting biliary obstruction.      Intake/Output Summary (Last 24 hours) at 05/20/13 1221 Last data filed at 05/20/13 1100  Gross per 24 hour  Intake   1190 ml  Output    750 ml  Net    440 ml    Current meds: . amLODipine  10 mg Oral Daily  . enoxaparin (LOVENOX) injection  30 mg Subcutaneous Q24H  . gabapentin  100 mg Oral Daily  . hydrALAZINE  25 mg Oral Q6H  . [START ON 05/21/2013] simvastatin  10 mg Oral Custom   Infusions: . sodium chloride 75 mL/hr at 04/25/2013 2044     Objective:  Blood pressure 165/37, pulse 39, temperature 98.3 F (36.8 C), temperature source Oral, resp. rate 15, weight 64.2 kg (141 lb 8.6 oz), SpO2 94.00%. Weight change:   Physical Exam: General:  Elderly. Mildly tachypneic HEENT: normal Neck: supple. JVP 9-10 . Carotids 2+ bilat; + bilat radiated bruits. No lymphadenopathy or thryomegaly appreciated. Cor: PMI nondisplaced. Brady regular rhythm. 3/6 AS murmur S2 mildly decreased Lungs: clear Abdomen: soft, + tender in RUQ  No hepatosplenomegaly. No bruits or masses. Good bowel sounds. Extremities: no cyanosis, clubbing, rash, edema Neuro: alert & orientedx3, cranial nerves grossly intact. moves all 4 extremities w/o difficulty. Affect pleasant  Telemetry: NSR with 2:1 AVB V rate 40s  Lab Results: Basic Metabolic Panel:  Recent Labs Lab 05/05/2013 1536 05/20/13 0340  NA 129* 135*  K 4.6 4.1  CL 92* 97  CO2 23 21  GLUCOSE 104* 89  BUN 42* 39*  CREATININE 2.24* 2.03*  CALCIUM 8.9 8.8  MG 2.6*  --    Liver Function Tests:  Recent Labs Lab 05/01/2013 1536 05/20/13 0340  AST 18  47*  ALT 18 34  ALKPHOS 94 225*  BILITOT 0.4 0.4  PROT 6.6 6.2  ALBUMIN 3.0* 2.7*   No results found for this basename: LIPASE, AMYLASE,  in the last 168 hours No results found for this basename: AMMONIA,  in the last 168 hours CBC:  Recent Labs Lab 04/24/2013 1536 05/20/13 0340  WBC 11.4* 10.7*  NEUTROABS 7.6  --   HGB 8.3* 8.4*  HCT 24.8* 24.5*  MCV 85.2 84.8  PLT 301 305   Cardiac Enzymes:  Recent Labs Lab 05/05/2013 1536 05/17/2013 2045 05/20/13 0340  TROPONINI <0.30 <0.30 <0.30   BNP: No components found with this basename: POCBNP,  CBG: No results found for this basename: GLUCAP,  in the last 168 hours Microbiology: No results found for this basename: cult   No results found for this basename: CULT, SDES,  in the last 168 hours  Imaging: Dg Chest 2 View  05/09/2013   CLINICAL DATA:  FATIGUE, WEAKNESS  EXAM: CHEST  2 VIEW  COMPARISON:  DG CHEST 2V dated 09/13/2012  FINDINGS: There is no focal parenchymal opacity, pleural effusion, or pneumothorax. THERE IS STABLE CARDIOMEGALY.  The osseous structures are unremarkable.  IMPRESSION: No acute cardiopulmonary disease.   Electronically Signed   By: Elige Ko   On: 05/11/2013 16:06   Ct Head Wo Contrast  05/16/2013   CLINICAL DATA:  Headache weakness  EXAM: CT HEAD WITHOUT CONTRAST  TECHNIQUE: Contiguous axial images were obtained from the base of the skull through the vertex without intravenous contrast.  COMPARISON:  None.  FINDINGS: Frontal sinus and multiple ethmoid air cells on the left side are opacified, and there is polypoid thickening of the mucosa of the left middle turbinate.  There is mild to moderate diffuse atrophy and there is mild low attenuation in the deep white matter. No evidence of vascular territory infarct. No hemorrhage or extra-axial fluid. No hydrocephalus or mass.  IMPRESSION: Left-sided paranasal sinus disease. Chronic age-related involutional change in the brain.   Electronically Signed   By:  Esperanza Heiraymond  Rubner M.D.   On: 05/16/2013 17:43   [place] Abdomen Complete  05/20/2013   CLINICAL DATA:  Abdominal pain  EXAM: COMPLETE ABDOMINAL ULTRASOUND  COMPARISON:  None available  FINDINGS: Gallbladder: Distended. No gallbladder wall thickening. There is a single 7 mm stone in the dependent aspect. No pericholecystic fluid. Sonographer reports no sonographic Murphy's sign.  Common bile duct:  Dilated common 12 mm diameter.  Liver: Suspect mild intrahepatic biliary ductal dilatation. No focal lesion.  IVC:  Negative  Pancreas:  Negative  Spleen:  No focal lesion, craniocaudal 4.2cm in length.  Right Kidney: Echogenic parenchyma. Multiple cysts, largest in the upper pole 25 x 29 x 30 mm. No hydronephrosis.  Left Kidney: Not identified. Patient relates history of nephrectomy.  Abdominal aorta:  Atheromatous without evidence of aneurysm  IMPRESSION: 1. Cholelithiasis with distended gallbladder and mild intrahepatic biliary ductal dilatation suggesting biliary obstruction. 2. Multiple right renal cysts on a background of echogenic parenchyma, a nonspecific indicator suggesting medical renal disease. 3. Non visualization of left kidney consistent with history nephrectomy.   Electronically Signed   By: Oley Balmaniel  Hassell M.D.   On: 05/20/2013 10:13     ASSESSMENT:  1. Bradycardia with intermittent 2:1 AVB 2. Acute renal failure 3. acute cholecystitis 4. Aortic stenosis on exam - probably moderate 5. RBBB 6. DNR/DNI  PLAN/DISCUSSION:  Difficult situation. Suspect 2:1 AVB due to increased vagal tone (from ab pain) in the setting of underlying conduction disease. Remains hemodynamically stable with SBP 150-160 range. Agree with holding lopressor and clonidine. Will also stop amlodipine due to potential to contribute to av block. No need for pacing at this time. On exam, she does have AS and is mildly volume overloaded so would cut IVF back to 25 cc/hr. Can give lasix as needed. Echo pending. Keep Zoll's and tropine  at bedside.   Use hydralazine for HTN. Can use prn hydralazine for SBP > 170.   Appears to have acute cholecystitis. Given age and comorbidities may be best to start with percutaneous drainage - await [facility] recs.   Tymber Stallings,MD 12:36 PM    LOS: 1 day

## 2013-05-20 DEATH — deceased

## 2013-05-21 ENCOUNTER — Inpatient Hospital Stay (HOSPITAL_COMMUNITY): Payer: Medicare Other

## 2013-05-21 DIAGNOSIS — K810 Acute cholecystitis: Secondary | ICD-10-CM

## 2013-05-21 LAB — CBC
HCT: 24 % — ABNORMAL LOW (ref 36.0–46.0)
Hemoglobin: 7.6 g/dL — ABNORMAL LOW (ref 12.0–15.0)
MCH: 27.9 pg (ref 26.0–34.0)
MCHC: 31.7 g/dL (ref 30.0–36.0)
MCV: 88.2 fL (ref 78.0–100.0)
PLATELETS: 327 10*3/uL (ref 150–400)
RBC: 2.72 MIL/uL — ABNORMAL LOW (ref 3.87–5.11)
RDW: 17.3 % — ABNORMAL HIGH (ref 11.5–15.5)
WBC: 11.1 10*3/uL — ABNORMAL HIGH (ref 4.0–10.5)

## 2013-05-21 LAB — COMPREHENSIVE METABOLIC PANEL
ALBUMIN: 2.7 g/dL — AB (ref 3.5–5.2)
ALT: 24 U/L (ref 0–35)
AST: 26 U/L (ref 0–37)
Alkaline Phosphatase: 178 U/L — ABNORMAL HIGH (ref 39–117)
BUN: 39 mg/dL — ABNORMAL HIGH (ref 6–23)
CO2: 18 mEq/L — ABNORMAL LOW (ref 19–32)
Calcium: 8.3 mg/dL — ABNORMAL LOW (ref 8.4–10.5)
Chloride: 102 mEq/L (ref 96–112)
Creatinine, Ser: 2.65 mg/dL — ABNORMAL HIGH (ref 0.50–1.10)
GFR calc Af Amer: 16 mL/min — ABNORMAL LOW (ref >90)
GFR calc non Af Amer: 14 mL/min — ABNORMAL LOW (ref >90)
Glucose, Bld: 100 mg/dL — ABNORMAL HIGH (ref 70–99)
Potassium: 4.9 mEq/L (ref 3.7–5.3)
SODIUM: 139 meq/L (ref 137–147)
TOTAL PROTEIN: 6.2 g/dL (ref 6.0–8.3)
Total Bilirubin: 0.3 mg/dL (ref 0.3–1.2)

## 2013-05-21 MED ORDER — MORPHINE SULFATE 4 MG/ML IJ SOLN
2.6000 mg | Freq: Once | INTRAMUSCULAR | Status: AC
  Administered 2013-05-21: 2.6 mg via INTRAVENOUS

## 2013-05-21 MED ORDER — MORPHINE SULFATE 4 MG/ML IJ SOLN
INTRAMUSCULAR | Status: AC
  Filled 2013-05-21: qty 1

## 2013-05-21 MED ORDER — TECHNETIUM TC 99M MEBROFENIN IV KIT
5.0000 | PACK | Freq: Once | INTRAVENOUS | Status: AC | PRN
  Administered 2013-05-21: 5 via INTRAVENOUS

## 2013-05-21 MED ORDER — SODIUM CHLORIDE 0.9 % IV BOLUS (SEPSIS)
250.0000 mL | Freq: Once | INTRAVENOUS | Status: AC
  Administered 2013-05-21: 250 mL via INTRAVENOUS

## 2013-05-21 MED ORDER — HYDRALAZINE HCL 25 MG PO TABS
25.0000 mg | ORAL_TABLET | Freq: Four times a day (QID) | ORAL | Status: DC
Start: 2013-05-21 — End: 2013-05-24
  Administered 2013-05-22 – 2013-05-23 (×4): 25 mg via ORAL
  Filled 2013-05-21 (×17): qty 1

## 2013-05-21 NOTE — Care Management Note (Signed)
    Page 1 of 1   05/21/2013     1:15:14 PM   CARE MANAGEMENT NOTE 05/21/2013  Patient:  Katrina Beck,Katrina Beck   Account Number:  [id number]  Date Initiated:  05/21/2013  Documentation initiated by:  Junius CreamerWELL,DEBBIE  Subjective/Objective Assessment:   adm w brady     Action/Plan:   lives w fam, pcp dr Thayer Ohmchris guest   Anticipated DC Date:     Anticipated DC Plan:           Choice offered to / List presented to:             Status of service:   Medicare Important Message given?   (If response is "NO", the following Medicare IM given date fields will be blank) Date Medicare IM given:   Date Additional Medicare IM given:    Discharge Disposition:    Per UR Regulation:  Reviewed for med. necessity/level of care/duration of stay  If discussed at Long Length of Stay Meetings, dates discussed:    Comments:

## 2013-05-21 NOTE — Progress Notes (Signed)
hida scan with evidence chronic cholecystitis, don't think drain is answer and we had long discussion about surgery which I don't think is good idea.  We will keep npo, cont abx and I will discuss with cards.

## 2013-05-21 NOTE — Progress Notes (Signed)
Pt BP 102/52, MAP 58, HR 50. Called MD and received order to give 250 bolus. Will continue to monitor pt.

## 2013-05-21 NOTE — Progress Notes (Signed)
Subjective: Worsening right-sided abdominal pain. HR in mid 40s to mid 50s.  Objective: Vital signs in last 24 hours: Temp:  [97.4 F (36.3 C)-98.4 F (36.9 C)] 97.7 F (36.5 C) (02/02 0821) Pulse Rate:  [39-55] 45 (02/02 0800) Resp:  [15-23] 16 (02/02 0800) BP: (102-165)/(33-45) 115/39 mmHg (02/02 0821) SpO2:  [94 %-98 %] 98 % (02/02 0821) Weight change:  Last BM Date: 05/07/2013  PE: GEN:  NAD but appears a bit uncomfortable. ABD:  Mild distention, increase in right sided tenderness with some voluntary guarding.  Lab Results: CBC    Component Value Date/Time   WBC 11.1* 05/21/2013 0530   WBC 13.7* 01/31/2013 1237   RBC 2.72* 05/21/2013 0530   RBC 3.32* 01/31/2013 1237   HGB 7.6* 05/21/2013 0530   HGB 9.1* 01/31/2013 1237   HCT 24.0* 05/21/2013 0530   HCT 30.5* 01/31/2013 1237   PLT 327 05/21/2013 0530   MCV 88.2 05/21/2013 0530   MCV 92.0 01/31/2013 1237   MCH 27.9 05/21/2013 0530   MCH 27.4 01/31/2013 1237   MCHC 31.7 05/21/2013 0530   MCHC 29.8* 01/31/2013 1237   RDW 17.3* 05/21/2013 0530   LYMPHSABS 2.6 05/05/2013 1536   MONOABS 1.2* 04/21/2013 1536   EOSABS 0.0 04/29/2013 1536   BASOSABS 0.0 05/12/2013 1536   CMP     Component Value Date/Time   NA 135* 05/20/2013 0340   K 4.1 05/20/2013 0340   CL 97 05/20/2013 0340   CO2 21 05/20/2013 0340   GLUCOSE 89 05/20/2013 0340   BUN 39* 05/20/2013 0340   CREATININE 2.03* 05/20/2013 0340   CREATININE 1.71* 04/09/2013 1736   CALCIUM 8.8 05/20/2013 0340   PROT 6.2 05/20/2013 0340   ALBUMIN 2.7* 05/20/2013 0340   AST 47* 05/20/2013 0340   ALT 34 05/20/2013 0340   ALKPHOS 225* 05/20/2013 0340   BILITOT 0.4 05/20/2013 0340   GFRNONAA 19* 05/20/2013 0340   GFRAA 22* 05/20/2013 0340   Assessment:  1.  Gallstones with concern for acute cholecystitis.  Worsening abdominal pain over past 24 hours. 2.  Biliary dilatation on ultrasound. 3.  Elevated ALP with essentially normal transaminases and bilirubin.  Today's liver chemistries are pending. 4.  Bradycardia,  improved.  Plan:  1.  Continue medical therapy, including IV fluids and antibiotics. 2.  HIDA scan pending today.  If cystic duct obstruction is seen, would likely need percutaneous biliary drain, under direction of surgical consultants. 3.  Would not do ERCP at this time, based on current available information. 4.  Bradycardia management per primary team and cardiology consultants. 5.  Will follow.   Katrina Beck,Katrina Beck 05/21/2013, 9:25 AM

## 2013-05-21 NOTE — Progress Notes (Signed)
Pt BP 97/29 MAP 55 HR 50 after 250 bolus. MD order additional 250 bolus be given. Will continue to monitor pt.

## 2013-05-21 NOTE — Progress Notes (Signed)
[facility] PROGRESS NOTE  Benjiman CoreFay Carelock XBJ:[id number] DOB: 01/13/1913 DOA: Oct 05, 2013 PCP: Tally DueGUEST, CHRIS WARREN, MD  Assessment/Plan: 1. Abdominal pain-  Secondary to cholecystitis, Surgery has ordered HIDA scan, started on Iv Cipro, Labs pending this am, continue with NPO,  morphine prn for pain. 2. Bradycardia- HR improved after stopping catapres and Metoprolol, continue to monitor. Cardiology following. 3. Hypertension- BP stable, dropped BP last night due to increased dose of Hydralazine now back to  hydralazine 25 mg po q 6 hrs. 4. Acute on chronic kidney disease- Cr now coming back to baseline. 5. Hyponatremia- Resolved, likely due to dehydration. 6. DVT Prophylaxis- Lovenox   Code Status: DNR Family Communication: *Discussed with daughter at bedside Disposition Plan: Home when stable   Consultants:  Cardiology  Procedures:  None  Antibiotics:  None  HPI/Subjective: Patient seen and examined, HR has improved after stopping Catapres and Metoprolol. BP is well controlled with amlodipine and Hydralazine.  Today complains of abdominal pain, no nausea or vomiting.   Objective: Filed Vitals:   05/21/13 0400  BP: 102/33  Pulse: 49  Temp: 97.7 F (36.5 C)  Resp: 15    Intake/Output Summary (Last 24 hours) at 05/21/13 0746 Last data filed at 05/21/13 0700  Gross per 24 hour  Intake   1700 ml  Output    300 ml  Net   1400 ml   Filed Weights   Sep 07, 2013 1901  Weight: 64.2 kg (141 lb 8.6 oz)    Exam:  Physical Exam: Head: Normocephalic, atraumatic.  Eyes: No signs of jaundice, EOMI Nose: Mucous membranes dry.  Throat: Oropharynx nonerythematous, no exudate appreciated.  Neck: supple,No deformities, masses, or tenderness noted. Lungs: Normal respiratory effort. B/L Clear to auscultation, no crackles or wheezes.  Heart: Regular RR. S1 and S2 normal  Abdomen: BS normoactive. Soft, positive RUQ tenderness to palpation Extremities: No pretibial edema, no  erythema   Data Reviewed: Basic Metabolic Panel:  Recent Labs Lab Sep 07, 2013 1536 05/20/13 0340  NA 129* 135*  K 4.6 4.1  CL 92* 97  CO2 23 21  GLUCOSE 104* 89  BUN 42* 39*  CREATININE 2.24* 2.03*  CALCIUM 8.9 8.8  MG 2.6*  --    Liver Function Tests:  Recent Labs Lab Sep 07, 2013 1536 05/20/13 0340  AST 18 47*  ALT 18 34  ALKPHOS 94 225*  BILITOT 0.4 0.4  PROT 6.6 6.2  ALBUMIN 3.0* 2.7*   No results found for this basename: LIPASE, AMYLASE,  in the last 168 hours No results found for this basename: AMMONIA,  in the last 168 hours CBC:  Recent Labs Lab Sep 07, 2013 1536 05/20/13 0340  WBC 11.4* 10.7*  NEUTROABS 7.6  --   HGB 8.3* 8.4*  HCT 24.8* 24.5*  MCV 85.2 84.8  PLT 301 305   Cardiac Enzymes:  Recent Labs Lab Sep 07, 2013 1536 Sep 07, 2013 2045 05/20/13 0340  TROPONINI <0.30 <0.30 <0.30   BNP (last 3 results) No results found for this basename: PROBNP,  in the last 8760 hours CBG: No results found for this basename: GLUCAP,  in the last 168 hours  Recent Results (from the past 240 hour(s))  MRSA PCR SCREENING     Status: None   Collection Time    Sep 07, 2013  8:28 PM      Result Value Range Status   MRSA by PCR NEGATIVE  NEGATIVE Final   Comment:            The GeneXpert MRSA Assay (FDA  approved for NASAL specimens     only), is one component of a     comprehensive MRSA colonization     surveillance program. It is not     intended to diagnose MRSA     infection nor to guide or     monitor treatment for     MRSA infections.     Studies: Dg Chest 2 View  2013/05/20   CLINICAL DATA:  FATIGUE, WEAKNESS  EXAM: CHEST  2 VIEW  COMPARISON:  DG CHEST 2V dated 09/13/2012  FINDINGS: There is no focal parenchymal opacity, pleural effusion, or pneumothorax. THERE IS STABLE CARDIOMEGALY.  The osseous structures are unremarkable.  IMPRESSION: No acute cardiopulmonary disease.   Electronically Signed   By: Elige Ko   On: May 20, 2013 16:06   Ct Head Wo  Contrast  05-20-2013   CLINICAL DATA:  Headache weakness  EXAM: CT HEAD WITHOUT CONTRAST  TECHNIQUE: Contiguous axial images were obtained from the base of the skull through the vertex without intravenous contrast.  COMPARISON:  None.  FINDINGS: Frontal sinus and multiple ethmoid air cells on the left side are opacified, and there is polypoid thickening of the mucosa of the left middle turbinate.  There is mild to moderate diffuse atrophy and there is mild low attenuation in the deep white matter. No evidence of vascular territory infarct. No hemorrhage or extra-axial fluid. No hydrocephalus or mass.  IMPRESSION: Left-sided paranasal sinus disease. Chronic age-related involutional change in the brain.   Electronically Signed   By: Esperanza Heir M.D.   On: 05-20-2013 17:43   US Abdomen Complete  05/20/2013   CLINICAL DATA:  Abdominal pain  EXAM: COMPLETE ABDOMINAL ULTRASOUND  COMPARISON:  None available  FINDINGS: Gallbladder: Distended. No gallbladder wall thickening. There is a single 7 mm stone in the dependent aspect. No pericholecystic fluid. Sonographer reports no sonographic Murphy's sign.  Common bile duct:  Dilated common 12 mm diameter.  Liver: Suspect mild intrahepatic biliary ductal dilatation. No focal lesion.  IVC:  Negative  Pancreas:  Negative  Spleen:  No focal lesion, craniocaudal 4.2cm in length.  Right Kidney: Echogenic parenchyma. Multiple cysts, largest in the upper pole 25 x 29 x 30 mm. No hydronephrosis.  Left Kidney: Not identified. Patient relates history of nephrectomy.  Abdominal aorta:  Atheromatous without evidence of aneurysm  IMPRESSION: 1. Cholelithiasis with distended gallbladder and mild intrahepatic biliary ductal dilatation suggesting biliary obstruction. 2. Multiple right renal cysts on a background of echogenic parenchyma, a nonspecific indicator suggesting medical renal disease. 3. Non visualization of left kidney consistent with history nephrectomy.   Electronically  Signed   By: Oley Balm M.D.   On: 05/20/2013 10:13    Scheduled Meds: . ciprofloxacin  400 mg Intravenous Q12H  . enoxaparin (LOVENOX) injection  30 mg Subcutaneous Q24H  . gabapentin  100 mg Oral Daily  . hydrALAZINE  25 mg Oral Q6H  . simvastatin  10 mg Oral Custom   Continuous Infusions: . sodium chloride 25 mL/hr at 05/20/13 1731    Active Problems:   HTN (hypertension)   Bradycardia   AKI (acute kidney injury)   Hyponatremia   Heart block AV second degree   Aortic stenosis    Time spent: 25 min    [facility]  [facility] Pager [phone]*. If 7PM-7AM, please contact night-coverage at [email or web address], password [facility] 05/21/2013, 7:46 AM  LOS: 2 days

## 2013-05-21 NOTE — Progress Notes (Signed)
Subjective:  C/o abdominal pain.   Not SOB.  Objective:  Vital Signs in the last 24 hours: BP 115/39  Pulse 45  Temp(Src) 97.7 F (36.5 C) (Oral)  Resp 16  Wt 64.2 kg (141 lb 8.6 oz)  SpO2 98%  Physical Exam: Elderly WF c/o abd pain Lungs:  Rales right base Cardiac:  Regular rhythm, normal S1 and S2, no S3 3/6 systolic murmur Abdomen: RUQ tenderness Extremities:  No edema present  Intake/Output from previous day: 02/01 0701 - 02/02 0700 In: 1700 [I.V.:800; IV Piggyback:900] Out: 300 [Urine:300] Weight Filed Weights   10/27/2013 1901  Weight: 64.2 kg (141 lb 8.6 oz)    Lab Results: Basic Metabolic Panel:  Recent Labs  04/54/0907/02/2014 1536 05/20/13 0340  NA 129* 135*  K 4.6 4.1  CL 92* 97  CO2 23 21  GLUCOSE 104* 89  BUN 42* 39*  CREATININE 2.24* 2.03*    CBC:  Recent Labs  10/27/2013 1536 05/20/13 0340 05/21/13 0530  WBC 11.4* 10.7* 11.1*  NEUTROABS 7.6  --   --   HGB 8.3* 8.4* 7.6*  HCT 24.8* 24.5* 24.0*  MCV 85.2 84.8 88.2  PLT 301 305 327   Telemetry: Appears to be in junctional rhythm or afib with rate regular at 53 this am.  Assessment/Plan:  1.  Intermittent 2:1 heart block this am better with holding clonidine and beta blocker 2. Known aortic stenosis. [place] and has known AS and an ECHO earlier this year 3. Acute on chronic renal failure  Rec:  Continue conservative treatment.  Family wishes to avoid surgery if possible. May need percutaneous drainage.      Darden PalmerW. Spencer Vinaya Sancho, Jr.  MD [facility]  05/21/2013, 9:21 AM

## 2013-05-21 NOTE — Progress Notes (Signed)
Patient ID: Benjiman CoreFay Desrocher, female   DOB: 06/24/1912, 80 y.o.   MRN: [id number]    Subjective: Pt c/o pain this morning along with nausea.  Daughter present at bedside and leads discussion  Objective: Vital signs in last 24 hours: Temp:  [97.4 F (36.3 C)-98.4 F (36.9 C)] 97.7 F (36.5 C) (02/02 0400) Pulse Rate:  [39-55] 49 (02/02 0400) Resp:  [15-23] 15 (02/02 0400) BP: (102-165)/(33-45) 102/33 mmHg (02/02 0400) SpO2:  [94 %-97 %] 97 % (02/02 0400) Last BM Date: 05/05/2013  Intake/Output from previous day: 02/01 0701 - 02/02 0700 In: 1700 [I.V.:800; IV Piggyback:900] Out: 300 [Urine:300] Intake/Output this shift:    PE: Abd: soft, tender in epigastrium into RUQ and RMQ, hypoactive BS, ND, lower midline scar from hysterectomy.  S/p left nephrectomy as well Heart: brady with + murmur Lungs: CTAB  Lab Results:   Recent Labs  05/12/2013 1536 05/20/13 0340  WBC 11.4* 10.7*  HGB 8.3* 8.4*  HCT 24.8* 24.5*  PLT 301 305   BMET  Recent Labs  04/25/2013 1536 05/20/13 0340  NA 129* 135*  K 4.6 4.1  CL 92* 97  CO2 23 21  GLUCOSE 104* 89  BUN 42* 39*  CREATININE 2.24* 2.03*  CALCIUM 8.9 8.8   PT/INR No results found for this basename: LABPROT, INR,  in the last 72 hours CMP     Component Value Date/Time   NA 135* 05/20/2013 0340   K 4.1 05/20/2013 0340   CL 97 05/20/2013 0340   CO2 21 05/20/2013 0340   GLUCOSE 89 05/20/2013 0340   BUN 39* 05/20/2013 0340   CREATININE 2.03* 05/20/2013 0340   CREATININE 1.71* 04/09/2013 1736   CALCIUM 8.8 05/20/2013 0340   PROT 6.2 05/20/2013 0340   ALBUMIN 2.7* 05/20/2013 0340   AST 47* 05/20/2013 0340   ALT 34 05/20/2013 0340   ALKPHOS 225* 05/20/2013 0340   BILITOT 0.4 05/20/2013 0340   GFRNONAA 19* 05/20/2013 0340   GFRAA 22* 05/20/2013 0340   Lipase  No results found for this basename: lipase       Studies/Results: Dg Chest 2 View  05/14/2013   CLINICAL DATA:  FATIGUE, WEAKNESS  EXAM: CHEST  2 VIEW  COMPARISON:  DG CHEST 2V dated 09/13/2012   FINDINGS: There is no focal parenchymal opacity, pleural effusion, or pneumothorax. THERE IS STABLE CARDIOMEGALY.  The osseous structures are unremarkable.  IMPRESSION: No acute cardiopulmonary disease.   Electronically Signed   By: Elige KoHetal  Patel   On: 05/17/2013 16:06   Ct Head Wo Contrast  05/08/2013   CLINICAL DATA:  Headache weakness  EXAM: CT HEAD WITHOUT CONTRAST  TECHNIQUE: Contiguous axial images were obtained from the base of the skull through the vertex without intravenous contrast.  COMPARISON:  None.  FINDINGS: Frontal sinus and multiple ethmoid air cells on the left side are opacified, and there is polypoid thickening of the mucosa of the left middle turbinate.  There is mild to moderate diffuse atrophy and there is mild low attenuation in the deep white matter. No evidence of vascular territory infarct. No hemorrhage or extra-axial fluid. No hydrocephalus or mass.  IMPRESSION: Left-sided paranasal sinus disease. Chronic age-related involutional change in the brain.   Electronically Signed   By: Esperanza Heiraymond  Rubner M.D.   On: 04/27/2013 17:43   [place] Abdomen Complete  05/20/2013   CLINICAL DATA:  Abdominal pain  EXAM: COMPLETE ABDOMINAL ULTRASOUND  COMPARISON:  None available  FINDINGS: Gallbladder: Distended. No gallbladder wall thickening. There  is a single 7 mm stone in the dependent aspect. No pericholecystic fluid. Sonographer reports no sonographic Murphy's sign.  Common bile duct:  Dilated common 12 mm diameter.  Liver: Suspect mild intrahepatic biliary ductal dilatation. No focal lesion.  IVC:  Negative  Pancreas:  Negative  Spleen:  No focal lesion, craniocaudal 4.2cm in length.  Right Kidney: Echogenic parenchyma. Multiple cysts, largest in the upper pole 25 x 29 x 30 mm. No hydronephrosis.  Left Kidney: Not identified. Patient relates history of nephrectomy.  Abdominal aorta:  Atheromatous without evidence of aneurysm  IMPRESSION: 1. Cholelithiasis with distended gallbladder and mild  intrahepatic biliary ductal dilatation suggesting biliary obstruction. 2. Multiple right renal cysts on a background of echogenic parenchyma, a nonspecific indicator suggesting medical renal disease. 3. Non visualization of left kidney consistent with history nephrectomy.   Electronically Signed   By: Oley Balm M.D.   On: 05/20/2013 10:13    Anti-infectives: Anti-infectives   Start     Dose/Rate Route Frequency Ordered Stop   05/20/13 1600  ciprofloxacin (CIPRO) IVPB 400 mg     400 mg 200 mL/hr over 60 Minutes Intravenous Every 12 hours 05/20/13 1515         Assessment/Plan  1.  ? Cholecystitis 2. Cholelithiasis 3. ? Choledocholithiasis Patient Active Problem List   Diagnosis Date Noted  . Cholecystitis, acute 05/21/2013  . Heart block AV second degree 05/20/2013  . Aortic stenosis 05/20/2013  . Bradycardia June 12, 2013  . AKI (acute kidney injury) Jun 12, 2013  . Hyponatremia June 12, 2013  . HTN (hypertension) 12/15/2011   Plan: 1. Patient is going down for a HIDA scan today at 9 oclock.  Labs are pending today.  HIDA should confirm CBD stone if there or +/- cholecystitis.  The patient nor the family would want an operation.  If there is a CBD stone present, then she will need an ERCP, +/- a perc drain depending on how she does after that.  If HIDA just shows acute chole then we will ask IR to evaluate the patient for perc drain.  The patient and daughter are aware of this and agreeable.  They do not want any type of operation.  I think that is reasonable given her age and cardiac conditions.  LOS: 2 days    Joyia Riehle E 05/21/2013, 8:21 AM Pager: [phone]

## 2013-05-22 ENCOUNTER — Encounter (HOSPITAL_COMMUNITY): Payer: Self-pay

## 2013-05-22 ENCOUNTER — Inpatient Hospital Stay (HOSPITAL_COMMUNITY): Payer: Medicare Other

## 2013-05-22 LAB — COMPREHENSIVE METABOLIC PANEL
ALBUMIN: 2.7 g/dL — AB (ref 3.5–5.2)
ALT: 22 U/L (ref 0–35)
AST: 28 U/L (ref 0–37)
Alkaline Phosphatase: 159 U/L — ABNORMAL HIGH (ref 39–117)
BUN: 46 mg/dL — AB (ref 6–23)
CHLORIDE: 101 meq/L (ref 96–112)
CO2: 15 mEq/L — ABNORMAL LOW (ref 19–32)
CREATININE: 3.64 mg/dL — AB (ref 0.50–1.10)
Calcium: 8.4 mg/dL (ref 8.4–10.5)
GFR calc Af Amer: 11 mL/min — ABNORMAL LOW (ref >90)
GFR calc non Af Amer: 10 mL/min — ABNORMAL LOW (ref >90)
Glucose, Bld: 93 mg/dL (ref 70–99)
Potassium: 4.7 mEq/L (ref 3.7–5.3)
Sodium: 136 mEq/L — ABNORMAL LOW (ref 137–147)
Total Bilirubin: 0.3 mg/dL (ref 0.3–1.2)
Total Protein: 6.2 g/dL (ref 6.0–8.3)

## 2013-05-22 LAB — CBC
HEMATOCRIT: 24.8 % — AB (ref 36.0–46.0)
Hemoglobin: 7.9 g/dL — ABNORMAL LOW (ref 12.0–15.0)
MCH: 28.1 pg (ref 26.0–34.0)
MCHC: 31.9 g/dL (ref 30.0–36.0)
MCV: 88.3 fL (ref 78.0–100.0)
Platelets: 321 10*3/uL (ref 150–400)
RBC: 2.81 MIL/uL — ABNORMAL LOW (ref 3.87–5.11)
RDW: 17.3 % — AB (ref 11.5–15.5)
WBC: 14.1 10*3/uL — AB (ref 4.0–10.5)

## 2013-05-22 MED ORDER — ONDANSETRON HCL 4 MG/2ML IJ SOLN
4.0000 mg | Freq: Four times a day (QID) | INTRAMUSCULAR | Status: DC | PRN
  Administered 2013-05-22 – 2013-05-23 (×4): 4 mg via INTRAVENOUS
  Filled 2013-05-22 (×4): qty 2

## 2013-05-22 MED ORDER — IPRATROPIUM-ALBUTEROL 0.5-2.5 (3) MG/3ML IN SOLN
3.0000 mL | RESPIRATORY_TRACT | Status: DC | PRN
  Filled 2013-05-22: qty 3

## 2013-05-22 NOTE — Progress Notes (Signed)
Feels much better, tol clears, hopefully can advance tomorrow, I don't think there is role for drain given chronic cholecystitis with gb that fills.

## 2013-05-22 NOTE — Progress Notes (Signed)
Subjective: Abdominal pain improving.  Objective: Vital signs in last 24 hours: Temp:  [98.2 F (36.8 C)-98.7 F (37.1 C)] 98.7 F (37.1 C) (02/03 0500) Pulse Rate:  [39-43] 43 (02/03 0500) Resp:  [12-21] 12 (02/03 0500) BP: (110-144)/(34-72) 144/41 mmHg (02/03 0500) SpO2:  [93 %-96 %] 96 % (02/03 0500) Weight:  [64.2 kg (141 lb 8.6 oz)] 64.2 kg (141 lb 8.6 oz) (02/03 0500) Weight change:  Last BM Date: 06-16-2013  PE: GEN:  NAD ABD:  Upper abdominal tenderness improved, no peritonitis  Lab Results: CMP     Component Value Date/Time   NA 136* 05/22/2013 0920   K 4.7 05/22/2013 0920   CL 101 05/22/2013 0920   CO2 15* 05/22/2013 0920   GLUCOSE 93 05/22/2013 0920   BUN 46* 05/22/2013 0920   CREATININE 3.64* 05/22/2013 0920   CREATININE 1.71* 04/09/2013 1736   CALCIUM 8.4 05/22/2013 0920   PROT 6.2 05/22/2013 0920   ALBUMIN 2.7* 05/22/2013 0920   AST 28 05/22/2013 0920   ALT 22 05/22/2013 0920   ALKPHOS 159* 05/22/2013 0920   BILITOT 0.3 05/22/2013 0920   GFRNONAA 10* 05/22/2013 0920   GFRAA 11* 05/22/2013 0920   Studies/Results: Nm Hepatobiliary Liver Func  05/21/2013   CLINICAL DATA:  80 year old female with right upper quadrant pain. Cholelithiasis and biliary ductal dilatation on ultrasound. Initial encounter.  EXAM: NUCLEAR MEDICINE HEPATOBILIARY IMAGING  TECHNIQUE: Sequential images of the abdomen were obtained out to 60 minutes following intravenous administration of radiopharmaceutical.  COMPARISON:  Abdominal ultrasound 05/20/2013.  RADIOPHARMACEUTICALS:  5.0 mCi Tc-6280m Choletec  Medication:  2.6 mg IV morphine once.  FINDINGS: Fairly prompt clearance of the blood pool and radiotracer concentration in the liver. Suggestion of cardiomegaly.  Central biliary tree activity by 30-35 min. No gallbladder activity after 60 min. There is small bowel activity by 55 min.  The study was then augmented with morphine. The gallbladder immediately is apparent and progressively fills following the administration of  morphine. At 2 hr residual activity is within the gallbladder and small bowel.  IMPRESSION: 1. Gallbladder becomes visible only after this study was augmented with IV morphine, indicating patent cystic duct but raising the possibility of chronic cholecystitis. 2. Patent CBD, though somewhat delayed appearance of the CBD and small bowel.   Electronically Signed   By: Augusto GambleLee  Hall M.D.   On: 05/21/2013 13:25   Assessment:  1.  Abdominal pain, improving. Suspect chronic cholecystitis.  Perhaps she had some cystic duct inflammatory-mediated occlusion that has improved with antibiotics.  Plan:  1.  Continue antibiotics. 2.  Diet trial. 3.  Hopefully patient won't end up needing surgery or percutaneous gallbladder drain. 4.  Will follow.   Freddy JakschUTLAW,Christeen Lai M 05/22/2013, 11:34 AM

## 2013-05-22 NOTE — Progress Notes (Signed)
Pt in Nuc Med today for test; Morphine 2.6mg  given as ordered by radiologist. Abigail MiyamotoWasted 1.4mg  of Morphine with RN Cyndia Divereresa Roberts- because syringe contained 4mg  of Morphine.

## 2013-05-22 NOTE — Progress Notes (Addendum)
[facility] PROGRESS NOTE  Katrina Beck OXB:[id number] DOB: 1912-05-03 DOA: 05/09/2013 PCP: Kennon Portela, MD  Interval history  80 year old female who has a past medical history of Macular degeneration; Hypertension; Neuropathy; Asthma; and Osteoporosis. today presented to the ED from the urgent care after patient was found to be bradycardic. As per patient she had constipation 2 days ago 4 that she took milk of magnesia and had 3 large bowel movements yesterday and has not been eating and drinking well. This morning patient tried to move also and was straining in the toilet, after the patient became very weak and became short of breath on exertion so she was seen at the urgent care clinic where she was found to have profound bradycardia with heart rate in 30s. Patient takes Catapres 0.1 3 times a day along with Toprol XL 25 mg daily. She took 1 dose of Catapres in the morning along with Toprol XL.  In the ED patient is found to be having sinus bradycardia chest x-ray is negative for acute cortical disease, CT head is negative, patient continues to have high blood pressure with systolic in the range of 683M to 200. Patient's medications including clonidine and metoprolol were held and HR improved, she complained of abdominal pain and had elevated alk phos. Abdominal ultrasound was obtained which showed gall stones and cholecystitis.Surgery was consulted, also she had biliary dilation so GI is on board as well.    Assessment/Plan:  1. Abdominal pain-  Secondary to chronic cholecystitis, HIDA scan shows chronic cholecystitis, no plans for surgery or percutaneous drain.Continue with IV Ciprofloxacin.  2. Bradycardia- 2:1 block,  HR improved after stopping catapres and Metoprolol, continue to monitor. Cardiology following.  3. Hypertension- BP stable, dropped BP last night due to increased dose of Hydralazine now back to  hydralazine 25 mg po q 6 hrs.  4. Dyspnea- ? Pul edema, will check CXR  and BNP. If CXR shows edema will give IV lasix. Will start Duonebs Q 4 hr prn.  5. Acute on chronic kidney disease- Cr now worse today, CXR done today does not show pulmonary edema, will increase the IV fluids to 75 ml/hr. Check BMP in am.  6. Hyponatremia- Resolved, likely due to dehydration.  7. DVT Prophylaxis- Lovenox   Code Status: DNR Family Communication: *Discussed with daughter at bedside Disposition Plan: Home when stable   Consultants:  Cardiology  Procedures:  None  Antibiotics:  None  HPI/Subjective: Patient seen and examined, HR has improved after stopping Catapres and Metoprolol. BP is well controlled with amlodipine and Hydralazine.  Patient is lethargic today, though she denies any abdominal pain.  Objective: Filed Vitals:   05/22/13 1100  BP:   Pulse: 52  Temp:   Resp: 18    Intake/Output Summary (Last 24 hours) at 05/22/13 1345 Last data filed at 05/22/13 1100  Gross per 24 hour  Intake    900 ml  Output      1 ml  Net    899 ml   Filed Weights   05/03/2013 1901 05/22/13 0500  Weight: 64.2 kg (141 lb 8.6 oz) 64.2 kg (141 lb 8.6 oz)    Exam:  Physical Exam: Head: Normocephalic, atraumatic.  Eyes: No signs of jaundice, EOMI Nose: Mucous membranes dry.  Throat: Oropharynx nonerythematous, no exudate appreciated.  Neck: supple,No deformities, masses, or tenderness noted. Lungs: Normal respiratory effort. B/L rhonchi Heart: Regular RR. S1 and S2 normal  Abdomen: BS normoactive. Soft, positive RUQ tenderness to palpation Extremities:  No pretibial edema, no erythema   Data Reviewed: Basic Metabolic Panel:  Recent Labs Lab 04/26/2013 1536 05/20/13 0340 05/21/13 0530 05/22/13 0920  NA 129* 135* 139 136*  K 4.6 4.1 4.9 4.7  CL 92* 97 102 101  CO2 23 21 18* 15*  GLUCOSE 104* 89 100* 93  BUN 42* 39* 39* 46*  CREATININE 2.24* 2.03* 2.65* 3.64*  CALCIUM 8.9 8.8 8.3* 8.4  MG 2.6*  --   --   --    Liver Function Tests:  Recent  Labs Lab 04/21/2013 1536 05/20/13 0340 05/21/13 0530 05/22/13 0920  AST 18 47* 26 28  ALT 18 34 24 22  ALKPHOS 94 225* 178* 159*  BILITOT 0.4 0.4 0.3 0.3  PROT 6.6 6.2 6.2 6.2  ALBUMIN 3.0* 2.7* 2.7* 2.7*   No results found for this basename: LIPASE, AMYLASE,  in the last 168 hours No results found for this basename: AMMONIA,  in the last 168 hours CBC:  Recent Labs Lab 04/27/2013 1536 05/20/13 0340 05/21/13 0530 05/22/13 0920  WBC 11.4* 10.7* 11.1* 14.1*  NEUTROABS 7.6  --   --   --   HGB 8.3* 8.4* 7.6* 7.9*  HCT 24.8* 24.5* 24.0* 24.8*  MCV 85.2 84.8 88.2 88.3  PLT 301 305 327 321   Cardiac Enzymes:  Recent Labs Lab 04/28/2013 1536 05/10/2013 2045 05/20/13 0340  TROPONINI <0.30 <0.30 <0.30   BNP (last 3 results) No results found for this basename: PROBNP,  in the last 8760 hours CBG: No results found for this basename: GLUCAP,  in the last 168 hours  Recent Results (from the past 240 hour(s))  MRSA PCR SCREENING     Status: None   Collection Time    04/19/2013  8:28 PM      Result Value Range Status   MRSA by PCR NEGATIVE  NEGATIVE Final   Comment:            The GeneXpert MRSA Assay (FDA     approved for NASAL specimens     only), is one component of a     comprehensive MRSA colonization     surveillance program. It is not     intended to diagnose MRSA     infection nor to guide or     monitor treatment for     MRSA infections.     Studies: Nm Hepatobiliary Liver Func  05/21/2013   CLINICAL DATA:  80 year old female with right upper quadrant pain. Cholelithiasis and biliary ductal dilatation on ultrasound. Initial encounter.  EXAM: NUCLEAR MEDICINE HEPATOBILIARY IMAGING  TECHNIQUE: Sequential images of the abdomen were obtained out to 60 minutes following intravenous administration of radiopharmaceutical.  COMPARISON:  Abdominal ultrasound 05/20/2013.  RADIOPHARMACEUTICALS:  5.0 mCi Tc-24mCholetec  Medication:  2.6 mg IV morphine once.  FINDINGS: Fairly prompt  clearance of the blood pool and radiotracer concentration in the liver. Suggestion of cardiomegaly.  Central biliary tree activity by 30-35 min. No gallbladder activity after 60 min. There is small bowel activity by 55 min.  The study was then augmented with morphine. The gallbladder immediately is apparent and progressively fills following the administration of morphine. At 2 hr residual activity is within the gallbladder and small bowel.  IMPRESSION: 1. Gallbladder becomes visible only after this study was augmented with IV morphine, indicating patent cystic duct but raising the possibility of chronic cholecystitis. 2. Patent CBD, though somewhat delayed appearance of the CBD and small bowel.   Electronically Signed  By: Lars Pinks M.D.   On: 05/21/2013 13:25    Scheduled Meds: . ciprofloxacin  400 mg Intravenous Q12H  . enoxaparin (LOVENOX) injection  30 mg Subcutaneous Q24H  . gabapentin  100 mg Oral Daily  . hydrALAZINE  25 mg Oral Q6H  . simvastatin  10 mg Oral Custom   Continuous Infusions: . sodium chloride 25 mL/hr at 05/20/13 1731    Active Problems:   HTN (hypertension)   Bradycardia   AKI (acute kidney injury)   Hyponatremia   Heart block AV second degree   Aortic stenosis   Cholecystitis, acute    Time spent: 25 min    [facility]  [facility] Pager [phone]*. If 7PM-7AM, please contact night-coverage at [email or web address], password [facility] 05/22/2013, 1:45 PM  LOS: 3 days

## 2013-05-22 NOTE — Progress Notes (Signed)
Patient ID: Katrina Beck, female   DOB: [birth year], 80 y.o.   MRN: [id number]    Subjective: Pt says she feels better today with less pain and nausea.  Hasn't had to taken any pain meds  Objective: Vital signs in last 24 hours: Temp:  [98.2 F (36.8 C)-98.7 F (37.1 C)] 98.7 F (37.1 C) (02/03 0500) Pulse Rate:  [39-43] 43 (02/03 0500) Resp:  [12-21] 12 (02/03 0500) BP: (110-144)/(34-72) 144/41 mmHg (02/03 0500) SpO2:  [93 %-96 %] 96 % (02/03 0500) Weight:  [141 lb 8.6 oz (64.2 kg)] 141 lb 8.6 oz (64.2 kg) (02/03 0500) Last BM Date: 05-28-2013  Intake/Output from previous day: 02/02 0701 - 02/03 0700 In: 925 [I.V.:525; IV Piggyback:400] Out: 22 [Urine:22] Intake/Output this shift:    PE: Abd: soft, but still tender in epigastrium and RUQ, +BS, mild bloating Heart: brady Lungs: CTAB  Lab Results:   Recent Labs  05/20/13 0340 05/21/13 0530  WBC 10.7* 11.1*  HGB 8.4* 7.6*  HCT 24.5* 24.0*  PLT 305 327   BMET  Recent Labs  05/20/13 0340 05/21/13 0530  NA 135* 139  K 4.1 4.9  CL 97 102  CO2 21 18*  GLUCOSE 89 100*  BUN 39* 39*  CREATININE 2.03* 2.65*  CALCIUM 8.8 8.3*   PT/INR No results found for this basename: LABPROT, INR,  in the last 72 hours CMP     Component Value Date/Time   NA 139 05/21/2013 0530   K 4.9 05/21/2013 0530   CL 102 05/21/2013 0530   CO2 18* 05/21/2013 0530   GLUCOSE 100* 05/21/2013 0530   BUN 39* 05/21/2013 0530   CREATININE 2.65* 05/21/2013 0530   CREATININE 1.71* 04/09/2013 1736   CALCIUM 8.3* 05/21/2013 0530   PROT 6.2 05/21/2013 0530   ALBUMIN 2.7* 05/21/2013 0530   AST 26 05/21/2013 0530   ALT 24 05/21/2013 0530   ALKPHOS 178* 05/21/2013 0530   BILITOT 0.3 05/21/2013 0530   GFRNONAA 14* 05/21/2013 0530   GFRAA 16* 05/21/2013 0530   Lipase  No results found for this basename: lipase       Studies/Results: Nm Hepatobiliary Liver Func  05/21/2013   CLINICAL DATA:  80 year old female with right upper quadrant pain. Cholelithiasis and biliary ductal  dilatation on ultrasound. Initial encounter.  EXAM: NUCLEAR MEDICINE HEPATOBILIARY IMAGING  TECHNIQUE: Sequential images of the abdomen were obtained out to 60 minutes following intravenous administration of radiopharmaceutical.  COMPARISON:  Abdominal ultrasound 05/20/2013.  RADIOPHARMACEUTICALS:  5.0 mCi Tc-7m Choletec  Medication:  2.6 mg IV morphine once.  FINDINGS: Fairly prompt clearance of the blood pool and radiotracer concentration in the liver. Suggestion of cardiomegaly.  Central biliary tree activity by 30-35 min. No gallbladder activity after 60 min. There is small bowel activity by 55 min.  The study was then augmented with morphine. The gallbladder immediately is apparent and progressively fills following the administration of morphine. At 2 hr residual activity is within the gallbladder and small bowel.  IMPRESSION: 1. Gallbladder becomes visible only after this study was augmented with IV morphine, indicating patent cystic duct but raising the possibility of chronic cholecystitis. 2. Patent CBD, though somewhat delayed appearance of the CBD and small bowel.   Electronically Signed   By: Augusto Gamble M.D.   On: 05/21/2013 13:25   US Abdomen Complete  05/20/2013   CLINICAL DATA:  Abdominal pain  EXAM: COMPLETE ABDOMINAL ULTRASOUND  COMPARISON:  None available  FINDINGS: Gallbladder: Distended. No gallbladder wall thickening. There  is a single 7 mm stone in the dependent aspect. No pericholecystic fluid. Sonographer reports no sonographic Murphy's sign.  Common bile duct:  Dilated common 12 mm diameter.  Liver: Suspect mild intrahepatic biliary ductal dilatation. No focal lesion.  IVC:  Negative  Pancreas:  Negative  Spleen:  No focal lesion, craniocaudal 4.2cm in length.  Right Kidney: Echogenic parenchyma. Multiple cysts, largest in the upper pole 25 x 29 x 30 mm. No hydronephrosis.  Left Kidney: Not identified. Patient relates history of nephrectomy.  Abdominal aorta:  Atheromatous without evidence  of aneurysm  IMPRESSION: 1. Cholelithiasis with distended gallbladder and mild intrahepatic biliary ductal dilatation suggesting biliary obstruction. 2. Multiple right renal cysts on a background of echogenic parenchyma, a nonspecific indicator suggesting medical renal disease. 3. Non visualization of left kidney consistent with history nephrectomy.   Electronically Signed   By: Oley Balmaniel  Hassell M.D.   On: 05/20/2013 10:13    Anti-infectives: Anti-infectives   Start     Dose/Rate Route Frequency Ordered Stop   05/20/13 1600  ciprofloxacin (CIPRO) IVPB 400 mg     400 mg 200 mL/hr over 60 Minutes Intravenous Every 12 hours 05/20/13 1515         Assessment/Plan  1. Chronic cholecystitis 2. Severe aortic stenosis Patient Active Problem List   Diagnosis Date Noted  . Cholecystitis, acute 05/21/2013  . Heart block AV second degree 05/20/2013  . Aortic stenosis 05/20/2013  . Bradycardia 11/12/2013  . AKI (acute kidney injury) 11/12/2013  . Hyponatremia 11/12/2013  . HTN (hypertension) 12/15/2011   Plan: 1. Patient still has pain with palpation, but states she feels better.  Just now drawing labs.  Will try clear liquids today and see how she does.  We will continue to treat with conservative management for now as patient refuses surgery and a perc drain will likely not help with chronic cholecystitis.  Will continue to follow and follow labs.   LOS: 3 days    Shataria Crist E 05/22/2013, 9:13 AM Pager: [phone]

## 2013-05-22 NOTE — Progress Notes (Signed)
Subjective:  Feeling better today.  Says she wants to eat.  No SOB or chest pain.  She does not wish to have abdominal surgery but would be willing to have a pacer.  Her QOL was good prior to coming here with good mental function.  The primary reason for the visit was the low heart rate noted by her son.   Objective:  Vital Signs in the last 24 hours: BP 144/41  Pulse 43  Temp(Src) 98.7 F (37.1 C) (Oral)  Resp 12  Wt 64.2 kg (141 lb 8.6 oz)  SpO2 96%  Physical Exam: Elderly WF c/o abd pain Lungs:  Rales right base Cardiac:  Regular rhythm, normal S1 and S2, no S3 3/6 systolic murmur Extremities:  No edema present  Intake/Output from previous day: 02/02 0701 - 02/03 0700 In: 925 [I.V.:525; IV Piggyback:400] Out: 22 [Urine:22] Weight Filed Weights   03/26/14 1901 05/22/13 0500  Weight: 64.2 kg (141 lb 8.6 oz) 64.2 kg (141 lb 8.6 oz)    Lab Results: Basic Metabolic Panel:  Recent Labs  78/29/5600/05/03 0340 05/21/13 0530  NA 135* 139  K 4.1 4.9  CL 97 102  CO2 21 18*  GLUCOSE 89 100*  BUN 39* 39*  CREATININE 2.03* 2.65*    CBC:  Recent Labs  03/26/14 1536 05/20/13 0340 05/21/13 0530  WBC 11.4* 10.7* 11.1*  NEUTROABS 7.6  --   --   HGB 8.3* 8.4* 7.6*  HCT 24.8* 24.5* 24.0*  MCV 85.2 84.8 88.2  PLT 301 305 327   Telemetry: Now 2:1 block rate around 44.   Assessment/Plan:  1. 2:1 heart block.   2. Known aortic stenosis severe by ECHO. 3. Acute on chronic renal failure  Rec:  Her abdomen is better.  Long discussion with son and patient re options.  She does not wish to have surgery for her GB.  SHe is at risk of progression of her heart block and had a good QOL prior to admission.    I would recommend watching BG as she is feeling better and continuing to hold meds.  IF her GB progressively improves, then may be candidate for PPM to prevent syncope or progression to further heart block. I will ask for EP input.  Darden PalmerW. Spencer Yuli Lanigan, Jr.  MD  [facility]ACC [facility]  05/22/2013, 8:22 AM

## 2013-05-23 ENCOUNTER — Inpatient Hospital Stay (HOSPITAL_COMMUNITY): Payer: Medicare Other

## 2013-05-23 LAB — LACTIC ACID, PLASMA: LACTIC ACID, VENOUS: 1.1 mmol/L (ref 0.5–2.2)

## 2013-05-23 LAB — CBC
HCT: 23.3 % — ABNORMAL LOW (ref 36.0–46.0)
Hemoglobin: 7.4 g/dL — ABNORMAL LOW (ref 12.0–15.0)
MCH: 28.2 pg (ref 26.0–34.0)
MCHC: 31.8 g/dL (ref 30.0–36.0)
MCV: 88.9 fL (ref 78.0–100.0)
PLATELETS: 333 10*3/uL (ref 150–400)
RBC: 2.62 MIL/uL — AB (ref 3.87–5.11)
RDW: 17.6 % — ABNORMAL HIGH (ref 11.5–15.5)
WBC: 12.9 10*3/uL — AB (ref 4.0–10.5)

## 2013-05-23 LAB — CREATININE, URINE, RANDOM: CREATININE, URINE: 184.65 mg/dL

## 2013-05-23 LAB — BASIC METABOLIC PANEL
BUN: 50 mg/dL — ABNORMAL HIGH (ref 6–23)
CHLORIDE: 100 meq/L (ref 96–112)
CO2: 17 meq/L — AB (ref 19–32)
Calcium: 8.1 mg/dL — ABNORMAL LOW (ref 8.4–10.5)
Creatinine, Ser: 4.37 mg/dL — ABNORMAL HIGH (ref 0.50–1.10)
GFR calc Af Amer: 9 mL/min — ABNORMAL LOW (ref >90)
GFR calc non Af Amer: 8 mL/min — ABNORMAL LOW (ref >90)
Glucose, Bld: 106 mg/dL — ABNORMAL HIGH (ref 70–99)
Potassium: 5 mEq/L (ref 3.7–5.3)
Sodium: 134 mEq/L — ABNORMAL LOW (ref 137–147)

## 2013-05-23 LAB — SODIUM, URINE, RANDOM: Sodium, Ur: 20 mEq/L

## 2013-05-23 MED ORDER — SODIUM CHLORIDE 0.9 % IV SOLN
INTRAVENOUS | Status: AC
  Administered 2013-05-23: 16:00:00 via INTRAVENOUS

## 2013-05-23 MED ORDER — SODIUM CHLORIDE 0.9 % IV SOLN
INTRAVENOUS | Status: DC
  Administered 2013-05-23: 09:00:00 via INTRAVENOUS

## 2013-05-23 MED ORDER — SODIUM CHLORIDE 0.9 % IV SOLN: INTRAVENOUS | Status: AC

## 2013-05-23 MED ORDER — BISACODYL 10 MG RE SUPP
10.0000 mg | Freq: Once | RECTAL | Status: AC
  Administered 2013-05-23: 10 mg via RECTAL
  Filled 2013-05-23: qty 1

## 2013-05-23 MED ORDER — SIMETHICONE 80 MG PO CHEW
80.0000 mg | CHEWABLE_TABLET | Freq: Four times a day (QID) | ORAL | Status: DC | PRN
  Administered 2013-05-23 – 2013-05-24 (×2): 80 mg via ORAL
  Filled 2013-05-23 (×3): qty 1

## 2013-05-23 MED ORDER — SODIUM CHLORIDE 0.9 % IV SOLN: INTRAVENOUS | Status: DC

## 2013-05-23 MED ORDER — SODIUM CHLORIDE 0.9 % IV BOLUS (SEPSIS)
500.0000 mL | Freq: Once | INTRAVENOUS | Status: AC
  Administered 2013-05-23: 500 mL via INTRAVENOUS

## 2013-05-23 NOTE — Progress Notes (Addendum)
May need suppository again, does not want surgery and I think that is reasonable.  Drain not really an option either I don't think.  Has other issues right now with kidney

## 2013-05-23 NOTE — Progress Notes (Addendum)
[facility] PROGRESS NOTE Interim History: 80 year old female who has a past medical history of Macular degeneration; Hypertension; Neuropathy; Asthma; and Osteoporosis. today presented to the ED from the urgent care after patient was found to be bradycardic. As per patient she had constipation 2 days ago 4 that she took milk of magnesia and had 3 large bowel movements yesterday and has not been eating and drinking well. This morning patient tried to move also and was straining in the toilet, after the patient became very weak and became short of breath on exertion so she was seen at the urgent care clinic where she was found to have profound bradycardia with heart rate in 30s. Patient takes Catapres 0.1 3 times a day along with Toprol XL 25 mg daily. She took 1 dose of Catapres in the morning along with Toprol XL.   Assessment/Plan:  Cholecystitis, chronic: - Perhaps she had some cystic duct inflammatory-mediated occlusion that has improved with antibiotics. - Appreciate surgery and GI assistance. - Cont Abx. ? If need to broaden antibiotics. - not hungry her BUN cont to rise could be contributing to her nausea, see below.  Bradycardia: -  HR improved after stopping catapres and Metoprolol, continue to monitor.   Hypertension - BP stable, dropped BP last night due to increased dose of Hydralazine. - d/c IV hydralazine allow kidney to eprfuse. - goal < 180.  Acute on chronic kidney disease: - Cr now worse today, NS Bolus. No JVD. - Insert foley, strict I & O's. - Check Urine Sodium and Cr. Check a lactic acid.  Hyponatremia: - worsen today give bolus, increase IV fluids. - Check Urine Sodium.    Code Status: DNR  Family Communication: *Discussed with daughter at bedside  Disposition Plan: Home when stable    Consultants:  GI  Surgery  Procedures:  cardiology  Antibiotics:  cipro  HPI/Subjective: She feels weak. Cont to have abdominal  pain  Objective: Filed Vitals:   05/23/13 0028 05/23/13 0400 05/23/13 0600 05/23/13 0714  BP: 96/37 115/51 113/53 140/60  Pulse: 46   53  Temp: 98.3 F (36.8 C) 98.2 F (36.8 C)  98.3 F (36.8 C)  TempSrc: Oral Oral  Oral  Resp: 15   21  Weight:      SpO2: 100%   98%    Intake/Output Summary (Last 24 hours) at 05/23/13 0731 Last data filed at 05/23/13 0400  Gross per 24 hour  Intake 1956.67 ml  Output    400 ml  Net 1556.67 ml   Filed Weights   05/11/2013 1901 05/22/13 0500  Weight: 64.2 kg (141 lb 8.6 oz) 64.2 kg (141 lb 8.6 oz)    Exam:  General: Alert, awake, oriented x3, in no acute distress.  HEENT: No bruits, no goiter. NO JVD. Heart: Regular rate and rhythm, without murmurs, rubs, gallops.  Lungs: Good air movement, clear to auscultation. Abdomen: Soft, nontender, nondistended, positive bowel sounds.     Data Reviewed: Basic Metabolic Panel:  Recent Labs Lab 05/12/2013 1536 05/20/13 0340 05/21/13 0530 05/22/13 0920 05/23/13 0238  NA 129* 135* 139 136* 134*  K 4.6 4.1 4.9 4.7 5.0  CL 92* 97 102 101 100  CO2 23 21 18* 15* 17*  GLUCOSE 104* 89 100* 93 106*  BUN 42* 39* 39* 46* 50*  CREATININE 2.24* 2.03* 2.65* 3.64* 4.37*  CALCIUM 8.9 8.8 8.3* 8.4 8.1*  MG 2.6*  --   --   --   --    Liver  Function Tests:  Recent Labs Lab 05/01/2013 1536 05/20/13 0340 05/21/13 0530 05/22/13 0920  AST 18 47* 26 28  ALT 18 34 24 22  ALKPHOS 94 225* 178* 159*  BILITOT 0.4 0.4 0.3 0.3  PROT 6.6 6.2 6.2 6.2  ALBUMIN 3.0* 2.7* 2.7* 2.7*   No results found for this basename: LIPASE, AMYLASE,  in the last 168 hours No results found for this basename: AMMONIA,  in the last 168 hours CBC:  Recent Labs Lab 05/04/2013 1536 05/20/13 0340 05/21/13 0530 05/22/13 0920 05/23/13 0238  WBC 11.4* 10.7* 11.1* 14.1* 12.9*  NEUTROABS 7.6  --   --   --   --   HGB 8.3* 8.4* 7.6* 7.9* 7.4*  HCT 24.8* 24.5* 24.0* 24.8* 23.3*  MCV 85.2 84.8 88.2 88.3 88.9  PLT 301 305 327 321  333   Cardiac Enzymes:  Recent Labs Lab 04/25/2013 1536 05/02/2013 2045 05/20/13 0340  TROPONINI <0.30 <0.30 <0.30   BNP (last 3 results) No results found for this basename: PROBNP,  in the last 8760 hours CBG: No results found for this basename: GLUCAP,  in the last 168 hours  Recent Results (from the past 240 hour(s))  MRSA PCR SCREENING     Status: None   Collection Time    04/29/2013  8:28 PM      Result Value Range Status   MRSA by PCR NEGATIVE  NEGATIVE Final   Comment:            The GeneXpert MRSA Assay (FDA     approved for NASAL specimens     only), is one component of a     comprehensive MRSA colonization     surveillance program. It is not     intended to diagnose MRSA     infection nor to guide or     monitor treatment for     MRSA infections.     Studies: Nm Hepatobiliary Liver Func  05/21/2013   CLINICAL DATA:  80 year old female with right upper quadrant pain. Cholelithiasis and biliary ductal dilatation on ultrasound. Initial encounter.  EXAM: NUCLEAR MEDICINE HEPATOBILIARY IMAGING  TECHNIQUE: Sequential images of the abdomen were obtained out to 60 minutes following intravenous administration of radiopharmaceutical.  COMPARISON:  Abdominal ultrasound 05/20/2013.  RADIOPHARMACEUTICALS:  5.0 mCi Tc-68m Choletec  Medication:  2.6 mg IV morphine once.  FINDINGS: Fairly prompt clearance of the blood pool and radiotracer concentration in the liver. Suggestion of cardiomegaly.  Central biliary tree activity by 30-35 min. No gallbladder activity after 60 min. There is small bowel activity by 55 min.  The study was then augmented with morphine. The gallbladder immediately is apparent and progressively fills following the administration of morphine. At 2 hr residual activity is within the gallbladder and small bowel.  IMPRESSION: 1. Gallbladder becomes visible only after this study was augmented with IV morphine, indicating patent cystic duct but raising the possibility of  chronic cholecystitis. 2. Patent CBD, though somewhat delayed appearance of the CBD and small bowel.   Electronically Signed   By: Augusto Gamble M.D.   On: 05/21/2013 13:25   Dg Chest Port 1v Same Day  05/22/2013   CLINICAL DATA:  21 -year-old female with bradycardia. Aortic stenosis. Initial encounter. Query edema.  EXAM: PORTABLE CHEST - 1 VIEW SAME DAY  COMPARISON:  05/02/2013 and earlier.  FINDINGS: Portable AP semi upright view at at 1543 hrs. Mildly lower lung volumes. Resuscitation or pacer pad projects over the left chest near midline.  Stable cardiomegaly and mediastinal contours, with probable central pulmonary arterial and thoracic aortic enlargement. Allowing for portable technique, the lungs remain clear except for mild atelectasis, with no overt pulmonary edema.  IMPRESSION: Stable ventilation with mild atelectasis and no overt pulmonary edema.   Electronically Signed   By: Augusto Gamble M.D.   On: 05/22/2013 16:07    Scheduled Meds: . ciprofloxacin  400 mg Intravenous Q12H  . enoxaparin (LOVENOX) injection  30 mg Subcutaneous Q24H  . gabapentin  100 mg Oral Daily  . hydrALAZINE  25 mg Oral Q6H  . simvastatin  10 mg Oral Custom   Continuous Infusions: . sodium chloride 75 mL/hr at 05/22/13 2146     Marinda Elk  [facility] Pager [phone]. If 8PM-8AM, please contact night-coverage at [email or web address], password [facility] 05/23/2013, 7:31 AM  LOS: 4 days

## 2013-05-23 NOTE — Progress Notes (Signed)
Patient c/o mid sternal chest pain. She states that pain feels like a "hard pressure", facial grimacing also observed. Stat EKG performed. Soon after EKG was completed, patient states she is no longer having chest pain. Will continue to monitor.

## 2013-05-23 NOTE — Progress Notes (Signed)
Subjective: Had some worsening pain and abdominal distention overnight, much improved after recent passage of large amount of flatus.  Objective: Vital signs in last 24 hours: Temp:  [97.8 F (36.6 C)-99 F (37.2 C)] 97.8 F (36.6 C) (02/04 1138) Pulse Rate:  [42-57] 46 (02/04 1138) Resp:  [12-21] 15 (02/04 1138) BP: (96-147)/(37-73) 103/73 mmHg (02/04 1138) SpO2:  [93 %-100 %] 93 % (02/04 1138) Weight change:  Last BM Date: 05/22/13  PE: GEN:  NAD ABD:  Soft, mild right upper abdominal tenderness, no peritonitis  Lab Results: CBC    Component Value Date/Time   WBC 12.9* 05/23/2013 0238   WBC 13.7* 01/31/2013 1237   RBC 2.62* 05/23/2013 0238   RBC 3.32* 01/31/2013 1237   HGB 7.4* 05/23/2013 0238   HGB 9.1* 01/31/2013 1237   HCT 23.3* 05/23/2013 0238   HCT 30.5* 01/31/2013 1237   PLT 333 05/23/2013 0238   MCV 88.9 05/23/2013 0238   MCV 92.0 01/31/2013 1237   MCH 28.2 05/23/2013 0238   MCH 27.4 01/31/2013 1237   MCHC 31.8 05/23/2013 0238   MCHC 29.8* 01/31/2013 1237   RDW 17.6* 05/23/2013 0238   LYMPHSABS 2.6 Sep 04, 2013 1536   MONOABS 1.2* Sep 04, 2013 1536   EOSABS 0.0 Sep 04, 2013 1536   BASOSABS 0.0 Sep 04, 2013 1536   CMP     Component Value Date/Time   NA 134* 05/23/2013 0238   K 5.0 05/23/2013 0238   CL 100 05/23/2013 0238   CO2 17* 05/23/2013 0238   GLUCOSE 106* 05/23/2013 0238   BUN 50* 05/23/2013 0238   CREATININE 4.37* 05/23/2013 0238   CREATININE 1.71* 04/09/2013 1736   CALCIUM 8.1* 05/23/2013 0238   PROT 6.2 05/22/2013 0920   ALBUMIN 2.7* 05/22/2013 0920   AST 28 05/22/2013 0920   ALT 22 05/22/2013 0920   ALKPHOS 159* 05/22/2013 0920   BILITOT 0.3 05/22/2013 0920   GFRNONAA 8* 05/23/2013 0238   GFRAA 9* 05/23/2013 0238   Studies/Results: Abdominal xray:  Possible mild ileus  Assessment:  1.  Abdominal pain, likely from smoldering cholecystitis, gradual improvement with medical therapy. 2.  Anemia, without overt blood GI blood loss. 3.  Abdominal distention, possible ileus on xray (likely  inflammatory-mediated from patient's cholecystitis), improved after recent large passage of flatus. 4.  Elevated LFTs, likely from cholecystitis, without evidence of biliary obstruction.  LFTs remain stable.  Plan:  1.  Continue antibiotics for cholecystitis. 2.  Clear liquid diet, advance slowly as tolerated. 3.  Laxatives as needed for constipation/ileus. 4.  In absence of overt GI bleeding, wouldn't do any GI tract evaluation into her anemia. 5.  Not much else to offer from GI perspective; will sign-off; please call with questions; thank you for the consult.   Freddy JakschUTLAW,Arie Powell M 05/23/2013, 12:18 PM

## 2013-05-23 NOTE — Progress Notes (Signed)
Subjective:  Nauseated today and does not feel well.  She has had worsening of her prior CKD with rise in creatinine today.  Review of records from [facility] shows she has a solitary kidney and had an admission for worsening renal function in October and responded to fluids.  Notes report she was able to do water aerobics and function well prior to coming in.    Objective:  Vital Signs in the last 24 hours: BP 140/60  Pulse 57  Temp(Src) 98.3 F (36.8 C) (Oral)  Resp 17  Wt 64.2 kg (141 lb 8.6 oz)  SpO2 93%  Physical Exam: Elderly WF c/o nausea Lungs:  Rales right base Cardiac:  Regular rhythm, normal S1 and S2, no S3 3/6 systolic murmur Extremities:  No edema present  Intake/Output from previous day: 02/03 0701 - 02/04 0700 In: 2181.7 [P.O.:720; I.V.:1061.7; IV Piggyback:400] Out: 400 [Urine:400] Weight Filed Weights   05/04/2013 1901 05/22/13 0500  Weight: 64.2 kg (141 lb 8.6 oz) 64.2 kg (141 lb 8.6 oz)    Lab Results: Basic Metabolic Panel:  Recent Labs  16/01/9601/03/15 0920 05/23/13 0238  NA 136* 134*  K 4.7 5.0  CL 101 100  CO2 15* 17*  GLUCOSE 93 106*  BUN 46* 50*  CREATININE 3.64* 4.37*    CBC:  Recent Labs  05/22/13 0920 05/23/13 0238  WBC 14.1* 12.9*  HGB 7.9* 7.4*  HCT 24.8* 23.3*  MCV 88.3 88.9  PLT 321 333   Telemetry: Now 2:1 block rate around 55, some intermittent 1:1 conduction seen  Assessment/Plan:  1. 2:1 heart block.   2. Known aortic stenosis severe by ECHO. 3. Acute on chronic renal failure worsening  Rec:  Clinically worse today.  Kidneys are worse.  I would hydrate and follow renal function. Defer consideration of PPM unless everything else gets back to baseline.  Darden PalmerW. Spencer Tilley, Jr.  MD [facility] Cardiology  05/23/2013, 9:25 AM

## 2013-05-23 NOTE — Progress Notes (Signed)
Patient ID: Katrina Beck, female   DOB: 01/08/1913, 80 y.o.   MRN: [id number]    Subjective: Pt c/o overall not feeling well this morning.  States belly pain is discomfort.  Liquids didn't make it worse.  Some nausea.  BM yesterday  Objective: Vital signs in last 24 hours: Temp:  [98.2 F (36.8 C)-99 F (37.2 C)] 98.3 F (36.8 C) (02/04 0714) Pulse Rate:  [39-53] 53 (02/04 0714) Resp:  [12-21] 21 (02/04 0714) BP: (96-147)/(37-60) 140/60 mmHg (02/04 0714) SpO2:  [87 %-100 %] 98 % (02/04 0714) Last BM Date: 05/22/13  Intake/Output from previous day: 02/03 0701 - 02/04 0700 In: 1956.7 [P.O.:720; I.V.:836.7; IV Piggyback:400] Out: 400 [Urine:400] Intake/Output this shift:    PE: Abd: soft, increased distention, few BS, tender in RUQ with palpation, otherwise NT Heart: regular, brady  Lab Results:   Recent Labs  05/22/13 0920 05/23/13 0238  WBC 14.1* 12.9*  HGB 7.9* 7.4*  HCT 24.8* 23.3*  PLT 321 333   BMET  Recent Labs  05/22/13 0920 05/23/13 0238  NA 136* 134*  K 4.7 5.0  CL 101 100  CO2 15* 17*  GLUCOSE 93 106*  BUN 46* 50*  CREATININE 3.64* 4.37*  CALCIUM 8.4 8.1*   PT/INR No results found for this basename: LABPROT, INR,  in the last 72 hours CMP     Component Value Date/Time   NA 134* 05/23/2013 0238   K 5.0 05/23/2013 0238   CL 100 05/23/2013 0238   CO2 17* 05/23/2013 0238   GLUCOSE 106* 05/23/2013 0238   BUN 50* 05/23/2013 0238   CREATININE 4.37* 05/23/2013 0238   CREATININE 1.71* 04/09/2013 1736   CALCIUM 8.1* 05/23/2013 0238   PROT 6.2 05/22/2013 0920   ALBUMIN 2.7* 05/22/2013 0920   AST 28 05/22/2013 0920   ALT 22 05/22/2013 0920   ALKPHOS 159* 05/22/2013 0920   BILITOT 0.3 05/22/2013 0920   GFRNONAA 8* 05/23/2013 0238   GFRAA 9* 05/23/2013 0238   Lipase  No results found for this basename: lipase       Studies/Results: Nm Hepatobiliary Liver Func  05/21/2013   CLINICAL DATA:  80 year old female with right upper quadrant pain. Cholelithiasis and biliary ductal  dilatation on ultrasound. Initial encounter.  EXAM: NUCLEAR MEDICINE HEPATOBILIARY IMAGING  TECHNIQUE: Sequential images of the abdomen were obtained out to 60 minutes following intravenous administration of radiopharmaceutical.  COMPARISON:  Abdominal ultrasound 05/20/2013.  RADIOPHARMACEUTICALS:  5.0 mCi Tc-695m Choletec  Medication:  2.6 mg IV morphine once.  FINDINGS: Fairly prompt clearance of the blood pool and radiotracer concentration in the liver. Suggestion of cardiomegaly.  Central biliary tree activity by 30-35 min. No gallbladder activity after 60 min. There is small bowel activity by 55 min.  The study was then augmented with morphine. The gallbladder immediately is apparent and progressively fills following the administration of morphine. At 2 hr residual activity is within the gallbladder and small bowel.  IMPRESSION: 1. Gallbladder becomes visible only after this study was augmented with IV morphine, indicating patent cystic duct but raising the possibility of chronic cholecystitis. 2. Patent CBD, though somewhat delayed appearance of the CBD and small bowel.   Electronically Signed   By: Augusto GambleLee  Hall M.D.   On: 05/21/2013 13:25   Dg Chest Port 1v Same Day  05/22/2013   CLINICAL DATA:  73Ninety-eight -year-old female with bradycardia. Aortic stenosis. Initial encounter. Query edema.  EXAM: PORTABLE CHEST - 1 VIEW SAME DAY  COMPARISON:  05/15/2013 and earlier.  FINDINGS: Portable AP semi upright view at at 1543 hrs. Mildly lower lung volumes. Resuscitation or pacer pad projects over the left chest near midline. Stable cardiomegaly and mediastinal contours, with probable central pulmonary arterial and thoracic aortic enlargement. Allowing for portable technique, the lungs remain clear except for mild atelectasis, with no overt pulmonary edema.  IMPRESSION: Stable ventilation with mild atelectasis and no overt pulmonary edema.   Electronically Signed   By: Augusto Gamble M.D.   On: 05/22/2013 16:07     Anti-infectives: Anti-infectives   Start     Dose/Rate Route Frequency Ordered Stop   05/20/13 1600  ciprofloxacin (CIPRO) IVPB 400 mg     400 mg 200 mL/hr over 60 Minutes Intravenous Every 12 hours 05/20/13 1515         Assessment/Plan  1. Chronic cholecystitis 2. ARF with uremia Patient Active Problem List   Diagnosis Date Noted  . Cholecystitis, chronic 05/21/2013  . Heart block AV second degree 05/20/2013  . Aortic stenosis 05/20/2013  . Bradycardia May 21, 2013  . AKI (acute kidney injury) May 21, 2013  . Hyponatremia May 21, 2013  . HTN (hypertension) 12/15/2011   Plan: 1. I suspect some of the patient not feeling well today is from her uremia; however, she is a bit more distended today.  I want to check a port plain film today.  She is still somewhat tender in RUQ.  Will hold off on diet advancement this morning.  Will recheck later today and see if she's feeling any better.  If so, maybe she can be advanced to fulls.  2. Will add simethicone for gas pain. 3. Cont abx therapy for her gallbladder.  Unfortunately, she is not a surgical candidate and we will have to treat her medically for her gallbladder.   LOS: 4 days    Valkyrie Guardiola E 05/23/2013, 7:52 AM Pager: [phone]

## 2013-05-24 LAB — POCT I-STAT 3, ART BLOOD GAS (G3+)
Acid-base deficit: 11 mmol/L — ABNORMAL HIGH (ref 0.0–2.0)
BICARBONATE: 16.7 meq/L — AB (ref 20.0–24.0)
O2 Saturation: 82 %
Patient temperature: 98.6
TCO2: 18 mmol/L (ref 0–100)
pCO2 arterial: 44.7 mmHg (ref 35.0–45.0)
pH, Arterial: 7.181 — CL (ref 7.350–7.450)
pO2, Arterial: 57 mmHg — ABNORMAL LOW (ref 80.0–100.0)

## 2013-05-24 LAB — BASIC METABOLIC PANEL
BUN: 50 mg/dL — ABNORMAL HIGH (ref 6–23)
CO2: 16 mEq/L — ABNORMAL LOW (ref 19–32)
Calcium: 7.9 mg/dL — ABNORMAL LOW (ref 8.4–10.5)
Chloride: 102 mEq/L (ref 96–112)
Creatinine, Ser: 4.79 mg/dL — ABNORMAL HIGH (ref 0.50–1.10)
GFR calc Af Amer: 8 mL/min — ABNORMAL LOW (ref >90)
GFR calc non Af Amer: 7 mL/min — ABNORMAL LOW (ref >90)
Glucose, Bld: 128 mg/dL — ABNORMAL HIGH (ref 70–99)
Potassium: 5.3 mEq/L (ref 3.7–5.3)
Sodium: 133 mEq/L — ABNORMAL LOW (ref 137–147)

## 2013-05-24 MED ORDER — NAPHAZOLINE-PHENIRAMINE 0.025-0.3 % OP SOLN
1.0000 [drp] | Freq: Four times a day (QID) | OPHTHALMIC | Status: DC | PRN
  Filled 2013-05-24: qty 15

## 2013-05-24 MED ORDER — MORPHINE SULFATE 2 MG/ML IJ SOLN
2.0000 mg | INTRAMUSCULAR | Status: DC | PRN
  Administered 2013-05-24: 2 mg via INTRAVENOUS
  Filled 2013-05-24: qty 1

## 2013-05-24 MED ORDER — CIPROFLOXACIN IN D5W 400 MG/200ML IV SOLN: 400.0000 mg | INTRAVENOUS | Status: DC

## 2013-05-24 MED ORDER — LORAZEPAM 2 MG/ML IJ SOLN: 1.0000 mg | INTRAMUSCULAR | Status: DC | PRN

## 2013-05-24 MED ORDER — HYDROCODONE-ACETAMINOPHEN 5-325 MG PO TABS: 1.0000 | ORAL_TABLET | Freq: Four times a day (QID) | ORAL | Status: DC | PRN

## 2013-05-24 MED ORDER — MORPHINE SULFATE 4 MG/ML IJ SOLN
4.0000 mg | Freq: Once | INTRAMUSCULAR | Status: AC
  Administered 2013-05-24: 4 mg via INTRAVENOUS
  Filled 2013-05-24: qty 1

## 2013-05-24 MED ORDER — SODIUM CHLORIDE 0.9 % IV SOLN: INTRAVENOUS | Status: DC

## 2013-05-24 MED ORDER — MORPHINE SULFATE 2 MG/ML IJ SOLN
2.0000 mg | INTRAMUSCULAR | Status: DC
  Filled 2013-05-24: qty 1

## 2013-05-24 MED ORDER — SODIUM CHLORIDE 0.9 % IV SOLN
1.0000 mg/h | INTRAVENOUS | Status: DC
  Administered 2013-05-24: 2 mg/h via INTRAVENOUS
  Administered 2013-05-25: 12 mg/h via INTRAVENOUS
  Filled 2013-05-24 (×3): qty 10

## 2013-05-24 NOTE — Progress Notes (Signed)
Emotional support given to patient and her family. Family requesting not to reposition her at this time. Requesting to keep lights off. Will increase morphine if patient is struggling. Will continue to monitor closely. Patients granddaughter to be coming in later tonight.

## 2013-05-24 NOTE — Progress Notes (Signed)
RT called back to draw another ABG due to ? Venous gas the 1st time. Patient and son have refused the 2nd stick. RN to make MD aware. RT will continue to monitor.

## 2013-05-24 NOTE — Progress Notes (Signed)
Per pt's family's request, pt not repositioned or checked for dryness. Family instructed to call with needs.  Katrina Beck, [name]

## 2013-05-24 NOTE — Progress Notes (Signed)
Subjective:  More lethargic and says can't swallow.  Poor po intake.  No labs to review at present  Objective:  Vital Signs in the last 24 hours: BP 109/46  Pulse 30  Temp(Src) 98.6 F (37 C) (Oral)  Resp 20  Wt 64.2 kg (141 lb 8.6 oz)  SpO2 92%  Physical Exam: Elderly WF c/o nausea Lungs:  Rales right base Cardiac:  irregular rhythm, normal S1 and S2, no S3 3/6 systolic murmur Extremities:  No edema present  Intake/Output from previous day: 02/04 0701 - 02/05 0700 In: 3622.9 [I.V.:2722.9; IV Piggyback:900] Out: 100 [Urine:100] Weight Filed Weights   05/02/2013 1901 05/22/13 0500  Weight: 64.2 kg (141 lb 8.6 oz) 64.2 kg (141 lb 8.6 oz)    Lab Results: Basic Metabolic Panel:  Recent Labs  19/14/7800/07/01 0920 05/23/13 0238  NA 136* 134*  K 4.7 5.0  CL 101 100  CO2 15* 17*  GLUCOSE 93 106*  BUN 46* 50*  CREATININE 3.64* 4.37*    CBC:  Recent Labs  05/22/13 0920 05/23/13 0238  WBC 14.1* 12.9*  HGB 7.9* 7.4*  HCT 24.8* 23.3*  MCV 88.3 88.9  PLT 321 333    Telemetry: Now 2:1 block rate around 55, some intermittent 1:1 conduction seen as well as Wenckebach rate around 60-66  Assessment/Plan:  1. Second degree heart block 2. Known aortic stenosis severe by ECHO. 3. Acute on chronic renal failure worsening yesterday 4. Cholecystitis  Rec:  Really doesn't look good.  Unless renal function improves suspect she will not leave the hospital. At this point rhythm is adequate.  Katrina Beck, Jr.  Katrina Beck [facility] Cardiology  05/24/2013, 10:00 AM

## 2013-05-24 NOTE — Progress Notes (Signed)
Patients daughter asking if we could decrease the morphine when granddaughter arrives to see if patient will wake up to speak to granddaughter.Still refusing to reposition patient. Will adjust medication as needed for patients comfort.

## 2013-05-24 NOTE — Progress Notes (Signed)
[facility] PROGRESS NOTE Interim History: 80 year old female who has a past medical history of Macular degeneration; Hypertension; Neuropathy; Asthma; and Osteoporosis. today presented to the ED from the urgent care after patient was found to be bradycardic. As per patient she had constipation 2 days ago 4 that she took milk of magnesia and had 3 large bowel movements yesterday and has not been eating and drinking well. This morning patient tried to move also and was straining in the toilet, after the patient became very weak and became short of breath on exertion so she was seen at the urgent care clinic where she was found to have profound bradycardia with heart rate in 30s. Patient takes Catapres 0.1 3 times a day along with Toprol XL 25 mg daily. She took 1 dose of Catapres in the morning along with Toprol XL.   Assessment/Plan:  Cholecystitis, chronic: - ? cystic duct inflammatory-mediated occlusion that has improved with antibiotics. - Appreciate surgery and GI assistance. - Worse abdominal pain. worse with food, having difficulty breathing due to abdominal third spacing. - D/w family they will like to move toward comfort care, stop antibiotics and fluids. - pt has express that she is tired of fighting that she is in pain and would like to be made comfortable. - morphine IV PRN. Ativan as needed. - pt refused blood gas.  Bradycardia: -  HR improved after stopping catapres and Metoprolol, continue to monitor.   Hypertension - BP stable, dropped BP last night due to increased dose of Hydralazine. - D/c IV hydralazine allow kidney to reprrfuse. - Goal < 180.  Acute on chronic kidney disease: - Cr now worse today.. - Did not tolerate foley, strict I & O's. - FENA < 1%, worsening renal failure. She is third spacing.  Hyponatremia: - worsen today give bolus, increase IV fluids. - Check Urine Sodium.    Code Status: DNR  Family Communication: Discussed with daughter at  bedside  Disposition Plan: Comfort care    Consultants:  GI  Surgery  Procedures:  cardiology  Antibiotics:  cipro  HPI/Subjective: She feels weak. Cont to have abdominal pain with breathing  Objective: Filed Vitals:   05/23/13 1935 05/23/13 2300 05/24/13 0000 05/24/13 0400  BP: 106/55 101/50 110/41 109/46  Pulse:   77   Temp: 99.6 F (37.6 C)  98.6 F (37 C)   TempSrc: Oral  Oral   Resp:  20 21   Weight:      SpO2: 94% 92% 88%     Intake/Output Summary (Last 24 hours) at 05/24/13 0717 Last data filed at 05/24/13 0500  Gross per 24 hour  Intake 3372.92 ml  Output    100 ml  Net 3272.92 ml   Filed Weights   05/02/2013 1901 05/22/13 0500  Weight: 64.2 kg (141 lb 8.6 oz) 64.2 kg (141 lb 8.6 oz)    Exam:  General: Alert, awake, oriented x3, in no acute distress.  HEENT: No bruits, no goiter. NO JVD. Heart: Regular rate and rhythm, without murmurs, rubs, gallops.  Lungs: Good air movement, clear to auscultation. Abdomen: Soft, RUQ tenderness, distended, positive bowel sounds.     Data Reviewed: Basic Metabolic Panel:  Recent Labs Lab 05/01/2013 1536 05/20/13 0340 05/21/13 0530 05/22/13 0920 05/23/13 0238  NA 129* 135* 139 136* 134*  K 4.6 4.1 4.9 4.7 5.0  CL 92* 97 102 101 100  CO2 23 21 18* 15* 17*  GLUCOSE 104* 89 100* 93 106*  BUN 42* 39* 39*  46* 50*  CREATININE 2.24* 2.03* 2.65* 3.64* 4.37*  CALCIUM 8.9 8.8 8.3* 8.4 8.1*  MG 2.6*  --   --   --   --    Liver Function Tests:  Recent Labs Lab 2013/05/27 1536 05/20/13 0340 05/21/13 0530 05/22/13 0920  AST 18 47* 26 28  ALT 18 34 24 22  ALKPHOS 94 225* 178* 159*  BILITOT 0.4 0.4 0.3 0.3  PROT 6.6 6.2 6.2 6.2  ALBUMIN 3.0* 2.7* 2.7* 2.7*   No results found for this basename: LIPASE, AMYLASE,  in the last 168 hours No results found for this basename: AMMONIA,  in the last 168 hours CBC:  Recent Labs Lab 27-May-2013 1536 05/20/13 0340 05/21/13 0530 05/22/13 0920 05/23/13 0238  WBC  11.4* 10.7* 11.1* 14.1* 12.9*  NEUTROABS 7.6  --   --   --   --   HGB 8.3* 8.4* 7.6* 7.9* 7.4*  HCT 24.8* 24.5* 24.0* 24.8* 23.3*  MCV 85.2 84.8 88.2 88.3 88.9  PLT 301 305 327 321 333   Cardiac Enzymes:  Recent Labs Lab 05-27-13 1536 05-27-13 2045 05/20/13 0340  TROPONINI <0.30 <0.30 <0.30   BNP (last 3 results) No results found for this basename: PROBNP,  in the last 8760 hours CBG: No results found for this basename: GLUCAP,  in the last 168 hours  Recent Results (from the past 240 hour(s))  MRSA PCR SCREENING     Status: None   Collection Time    27-May-2013  8:28 PM      Result Value Range Status   MRSA by PCR NEGATIVE  NEGATIVE Final   Comment:            The GeneXpert MRSA Assay (FDA     approved for NASAL specimens     only), is one component of a     comprehensive MRSA colonization     surveillance program. It is not     intended to diagnose MRSA     infection nor to guide or     monitor treatment for     MRSA infections.     Studies: Dg Chest Port 1v Same Day  05/22/2013   CLINICAL DATA:  54 -year-old female with bradycardia. Aortic stenosis. Initial encounter. Query edema.  EXAM: PORTABLE CHEST - 1 VIEW SAME DAY  COMPARISON:  2013-05-27 and earlier.  FINDINGS: Portable AP semi upright view at at 1543 hrs. Mildly lower lung volumes. Resuscitation or pacer pad projects over the left chest near midline. Stable cardiomegaly and mediastinal contours, with probable central pulmonary arterial and thoracic aortic enlargement. Allowing for portable technique, the lungs remain clear except for mild atelectasis, with no overt pulmonary edema.  IMPRESSION: Stable ventilation with mild atelectasis and no overt pulmonary edema.   Electronically Signed   By: Augusto Gamble M.D.   On: 05/22/2013 16:07   Dg Abd Portable 1v  05/23/2013   CLINICAL DATA:  Chronic cholecystitis. Abdominal distention. Nausea and vomiting.  EXAM: PORTABLE ABDOMEN - 1 VIEW  COMPARISON:  US ABDOMEN  COMPLETE dated 05/20/2013; DG ABD 2 VIEWS dated 05-27-2013  FINDINGS: Interval development of gas within upper normal caliber colon colon since the examination 5 days ago. No evidence of small bowel distention. No suggestion of free air on this supine image. Calcification in the left mid abdomen, possibly lymph node or epiploic appendage, as noted on the prior exam. Degenerative changes throughout the lumbar spine and in the left hip. Right hip arthroplasty with anatomic alignment.  Old healed fracture of the left symphysis pubis.  IMPRESSION: Possible developing colonic ileus. No acute abdominal abnormality otherwise.   Electronically Signed   By: Hulan Saas M.D.   On: 05/23/2013 08:29    Scheduled Meds: . ciprofloxacin  400 mg Intravenous Q12H  . enoxaparin (LOVENOX) injection  30 mg Subcutaneous Q24H  . gabapentin  100 mg Oral Daily  . hydrALAZINE  25 mg Oral Q6H  . simvastatin  10 mg Oral Custom   Continuous Infusions: . sodium chloride 125 mL/hr at 05/23/13 1559     FELIZ Rosine Beat  [facility] Pager [phone]. If 8PM-8AM, please contact night-coverage at [email or web address], password [facility] 05/24/2013, 7:17 AM  LOS: 5 days

## 2013-05-24 NOTE — Progress Notes (Signed)
Has been made comfort care

## 2013-05-24 NOTE — Progress Notes (Signed)
Patient ID: Katrina Beck, female   DOB: [birth year], 80 y.o.   MRN: [id number]    Subjective: Patient doesn't look well today.  Ate some pudding yesterday.  Has pain after just drinking a sip of tea this morning and 2 bites of grits.  Apparently having swallow study today due to signs of aspiration  Objective: Vital signs in last 24 hours: Temp:  [97.8 F (36.6 C)-99.6 F (37.6 C)] 98.6 F (37 C) (02/05 0000) Pulse Rate:  [46-77] 77 (02/05 0000) Resp:  [14-21] 21 (02/05 0000) BP: (100-125)/(41-73) 109/46 mmHg (02/05 0400) SpO2:  [88 %-100 %] 88 % (02/05 0000) Last BM Date: 05/24/13  Intake/Output from previous day: 02/04 0701 - 02/05 0700 In: 3622.9 [I.V.:2722.9; IV Piggyback:900] Out: 100 [Urine:100] Intake/Output this shift:    PE: Abd: soft, tenderness seems about the same, few BS Heart: new irregularity, in 70s (appears a fib)  Lab Results:   Recent Labs  05/22/13 0920 05/23/13 0238  WBC 14.1* 12.9*  HGB 7.9* 7.4*  HCT 24.8* 23.3*  PLT 321 333   BMET  Recent Labs  05/22/13 0920 05/23/13 0238  NA 136* 134*  K 4.7 5.0  CL 101 100  CO2 15* 17*  GLUCOSE 93 106*  BUN 46* 50*  CREATININE 3.64* 4.37*  CALCIUM 8.4 8.1*   PT/INR No results found for this basename: LABPROT, INR,  in the last 72 hours CMP     Component Value Date/Time   NA 134* 05/23/2013 0238   K 5.0 05/23/2013 0238   CL 100 05/23/2013 0238   CO2 17* 05/23/2013 0238   GLUCOSE 106* 05/23/2013 0238   BUN 50* 05/23/2013 0238   CREATININE 4.37* 05/23/2013 0238   CREATININE 1.71* 04/09/2013 1736   CALCIUM 8.1* 05/23/2013 0238   PROT 6.2 05/22/2013 0920   ALBUMIN 2.7* 05/22/2013 0920   AST 28 05/22/2013 0920   ALT 22 05/22/2013 0920   ALKPHOS 159* 05/22/2013 0920   BILITOT 0.3 05/22/2013 0920   GFRNONAA 8* 05/23/2013 0238   GFRAA 9* 05/23/2013 0238   Lipase  No results found for this basename: lipase       Studies/Results: Dg Chest Port 1v Same Day  05/22/2013   CLINICAL DATA:  42 -year-old female with  bradycardia. Aortic stenosis. Initial encounter. Query edema.  EXAM: PORTABLE CHEST - 1 VIEW SAME DAY  COMPARISON:  2013-06-13 and earlier.  FINDINGS: Portable AP semi upright view at at 1543 hrs. Mildly lower lung volumes. Resuscitation or pacer pad projects over the left chest near midline. Stable cardiomegaly and mediastinal contours, with probable central pulmonary arterial and thoracic aortic enlargement. Allowing for portable technique, the lungs remain clear except for mild atelectasis, with no overt pulmonary edema.  IMPRESSION: Stable ventilation with mild atelectasis and no overt pulmonary edema.   Electronically Signed   By: Augusto Gamble M.D.   On: 05/22/2013 16:07   Dg Abd Portable 1v  05/23/2013   CLINICAL DATA:  Chronic cholecystitis. Abdominal distention. Nausea and vomiting.  EXAM: PORTABLE ABDOMEN - 1 VIEW  COMPARISON:  US ABDOMEN COMPLETE dated 05/20/2013; DG ABD 2 VIEWS dated 06/13/2013  FINDINGS: Interval development of gas within upper normal caliber colon colon since the examination 5 days ago. No evidence of small bowel distention. No suggestion of free air on this supine image. Calcification in the left mid abdomen, possibly lymph node or epiploic appendage, as noted on the prior exam. Degenerative changes throughout the lumbar spine and in the left hip. Right hip  arthroplasty with anatomic alignment. Old healed fracture of the left symphysis pubis.  IMPRESSION: Possible developing colonic ileus. No acute abdominal abnormality otherwise.   Electronically Signed   By: Hulan Saashomas  Lawrence M.D.   On: 05/23/2013 08:29    Anti-infectives: Anti-infectives   Start     Dose/Rate Route Frequency Ordered Stop   05/20/13 1600  ciprofloxacin (CIPRO) IVPB 400 mg     400 mg 200 mL/hr over 60 Minutes Intravenous Every 12 hours 05/20/13 1515         Assessment/Plan  1. Chronic cholecystitis Patient Active Problem List   Diagnosis Date Noted  . Cholecystitis, chronic 05/21/2013  . Heart block AV  second degree 05/20/2013  . Aortic stenosis 05/20/2013  . Bradycardia 05-Aug-2013  . AKI (acute kidney injury) 05-Aug-2013  . Hyponatremia 05-Aug-2013  . HTN (hypertension) 12/15/2011  new irregularly irregular HR  Plan: 1. Overall, patient does not appear to be improving.  She states she feels worse today with less energy.  She appears to be aspirating and is getting a swallow study today.  She is between a rock and a hard place.  She is hungry at times, but every time she eats or drinks it causes worsening of her abdominal pain. 2. Unfortunately, there is not much else to offer for her gallbladder.  Surgery is not an option, a drain will not improve her condition given this is chronic not acute, and abx will only help so much.  The patient seems to be deteriorating overall.  Wonder if palliative care consult would be appropriate. 3. Will await advancing diet until swallow study complete.  Diet per primary team after swallow study.  Recommend treating her for comfort at this point.    LOS: 5 days    Nishanth Mccaughan E 05/24/2013, 8:15 AM Pager: [phone]

## 2013-05-24 NOTE — Progress Notes (Signed)
Pt started on a morphine gtt per MD orders at family request.  Pt with occasional moans and tremors.  Informed family that gtt can be titrated to maintain adequate comfort for patient.  At pt's family's request, I removed pt's rings using gauze and surgical lubrication with the assistance of family.  Will continue to monitor.  Toney Difatta, [facility]

## 2013-06-17 NOTE — Progress Notes (Signed)
No spontaneous respirations or heart beat; Asystole on the monitor at 1445.  Verified by 2 RNs at bedside after one full minute of auscultation; family present and MD notified, death certificate filled out accordingly.

## 2013-06-17 NOTE — Progress Notes (Signed)
Spoke to family and comfort care appreciated.  Patient resting with family at bedside and no significant pain or SOB.  Darden PalmerW. Spencer Trixie Maclaren, Jr. MD [facility] ServicesFACC

## 2013-06-17 NOTE — Progress Notes (Signed)
[facility] PROGRESS NOTE Interim History: 80 year old female who has a past medical history of Macular degeneration; Hypertension; Neuropathy; Asthma; and Osteoporosis. today presented to the ED from the urgent care after patient was found to be bradycardic. As per patient she had constipation 2 days ago 4 that she took milk of magnesia and had 3 large bowel movements yesterday and has not been eating and drinking well. This morning patient tried to move also and was straining in the toilet, after the patient became very weak and became short of breath on exertion so she was seen at the urgent care clinic where she was found to have profound bradycardia with heart rate in 30s. Patient takes Catapres 0.1 3 times a day along with Toprol XL 25 mg daily. She took 1 dose of Catapres in the morning along with Toprol XL.   Assessment/Plan:  Cholecystitis, chronic: - Appreciate surgery, cardiology and GI assistance. - Was having difficulty breathing due to abdominal third spacing. - D/w family they will like to move toward comfort care, stop antibiotics and fluids. - pt has express that she is tired of fighting that she is in pain and would like to be made comfortable. - morphine IV gtt PRN. Ativan as needed. - erratic breathing   Bradycardia: -  HR improved after stopping catapres and Metoprolol, continue to monitor.   Hypertension - Held all Bp meds.  Acute on chronic kidney disease: - Cr now worse today.. - Did not tolerate foley, strict I & O's. - FENA < 1%, worsening renal failure. She is third spacing.  Hyponatremia: - worsen today give bolus, increase IV fluids. - Check Urine Sodium.   Code Status: DNR  Family Communication: Discussed with daughter at bedside  Disposition Plan: Comfort care    Consultants:  GI  Surgery  Procedures:  cardiology  Antibiotics:  cipro  HPI/Subjective: She feels weak. Cont to have abdominal pain with  breathing  Objective: Filed Vitals:   06-18-13 0400 06/18/13 0500 06/18/2013 0600 18-Jun-2013 0700  BP:      Pulse: 30 34 35 52  Temp:      TempSrc:      Resp: 15 15 23 20   Weight:      SpO2: 91% 92% 92% 88%    Intake/Output Summary (Last 24 hours) at 2013-06-18 0729 Last data filed at 06-18-2013 0700  Gross per 24 hour  Intake  63.79 ml  Output      0 ml  Net  63.79 ml   Filed Weights   04/27/2013 1901 05/22/13 0500  Weight: 64.2 kg (141 lb 8.6 oz) 64.2 kg (141 lb 8.6 oz)    Exam:  General: In no acute distress.  HEENT: No bruits, no goiter. NO JVD. Heart: Regular rate and rhythm, without murmurs, rubs, gallops.  Lungs: Good air movement, clear to auscultation.   Data Reviewed: Basic Metabolic Panel:  Recent Labs Lab 05/10/2013 1536 05/20/13 0340 05/21/13 0530 05/22/13 0920 05/23/13 0238 05/24/13 0905  NA 129* 135* 139 136* 134* 133*  K 4.6 4.1 4.9 4.7 5.0 5.3  CL 92* 97 102 101 100 102  CO2 23 21 18* 15* 17* 16*  GLUCOSE 104* 89 100* 93 106* 128*  BUN 42* 39* 39* 46* 50* 50*  CREATININE 2.24* 2.03* 2.65* 3.64* 4.37* 4.79*  CALCIUM 8.9 8.8 8.3* 8.4 8.1* 7.9*  MG 2.6*  --   --   --   --   --    Liver Function Tests:  Recent Labs  Lab December 08, 2013 1536 05/20/13 0340 05/21/13 0530 05/22/13 0920  AST 18 47* 26 28  ALT 18 34 24 22  ALKPHOS 94 225* 178* 159*  BILITOT 0.4 0.4 0.3 0.3  PROT 6.6 6.2 6.2 6.2  ALBUMIN 3.0* 2.7* 2.7* 2.7*   No results found for this basename: LIPASE, AMYLASE,  in the last 168 hours No results found for this basename: AMMONIA,  in the last 168 hours CBC:  Recent Labs Lab December 08, 2013 1536 05/20/13 0340 05/21/13 0530 05/22/13 0920 05/23/13 0238  WBC 11.4* 10.7* 11.1* 14.1* 12.9*  NEUTROABS 7.6  --   --   --   --   HGB 8.3* 8.4* 7.6* 7.9* 7.4*  HCT 24.8* 24.5* 24.0* 24.8* 23.3*  MCV 85.2 84.8 88.2 88.3 88.9  PLT 301 305 327 321 333   Cardiac Enzymes:  Recent Labs Lab December 08, 2013 1536 December 08, 2013 2045 05/20/13 0340  TROPONINI <0.30  <0.30 <0.30   BNP (last 3 results) No results found for this basename: PROBNP,  in the last 8760 hours CBG: No results found for this basename: GLUCAP,  in the last 168 hours  Recent Results (from the past 240 hour(s))  MRSA PCR SCREENING     Status: None   Collection Time    December 08, 2013  8:28 PM      Result Value Range Status   MRSA by PCR NEGATIVE  NEGATIVE Final   Comment:            The GeneXpert MRSA Assay (FDA     approved for NASAL specimens     only), is one component of a     comprehensive MRSA colonization     surveillance program. It is not     intended to diagnose MRSA     infection nor to guide or     monitor treatment for     MRSA infections.     Studies: Dg Abd Portable 1v  05/23/2013   CLINICAL DATA:  Chronic cholecystitis. Abdominal distention. Nausea and vomiting.  EXAM: PORTABLE ABDOMEN - 1 VIEW  COMPARISON:  US ABDOMEN COMPLETE dated 05/20/2013; DG ABD 2 VIEWS dated Aug 26, 2013  FINDINGS: Interval development of gas within upper normal caliber colon colon since the examination 5 days ago. No evidence of small bowel distention. No suggestion of free air on this supine image. Calcification in the left mid abdomen, possibly lymph node or epiploic appendage, as noted on the prior exam. Degenerative changes throughout the lumbar spine and in the left hip. Right hip arthroplasty with anatomic alignment. Old healed fracture of the left symphysis pubis.  IMPRESSION: Possible developing colonic ileus. No acute abdominal abnormality otherwise.   Electronically Signed   By: Hulan Saashomas  Lawrence M.D.   On: 05/23/2013 08:29    Scheduled Meds:   Continuous Infusions: . morphine 10 mg/hr (06/14/2013 0600)     Marinda ElkFELIZ ORTIZ, [place]  [facility] Pager [phone]. If 8PM-8AM, please contact night-coverage at [email or web address], password [email or web address] 06/03/2013, 7:29 AM  LOS: 6 days

## 2013-06-17 NOTE — Discharge Summary (Signed)
Death Summary  Benjiman CoreFay Ramakrishnan ZOX:[id number] DOB: 03/06/1913 DOA: 04/21/2013  PCP: Tally DueGUEST, CHRIS WARREN, MD   Admit date: 05/14/2013 Date of Death: 06/05/2013  Final Diagnoses:  Active Problems:   HTN (hypertension)   Bradycardia   AKI (acute kidney injury)   Hyponatremia   Heart block AV second degree   Aortic stenosis   Cholecystitis, chronic   History of present illness:  80 year old female who has a past medical history of Macular degeneration; Hypertension; Neuropathy; Asthma; and Osteoporosis. today presented to the ED from the urgent care after patient was found to be bradycardic. As per patient she had constipation 2 days ago 4 that she took milk of magnesia and had 3 large bowel movements yesterday and has not been eating and drinking well. This morning patient tried to move also and was straining in the toilet, after the patient became very weak and became short of breath on exertion so she was seen at the urgent care clinic where she was found to have profound bradycardia with heart rate in 30s. Patient takes Catapres 0.1 3 times a day along with Toprol XL 25 mg daily. She took 1 dose of Catapres in the morning along with Toprol XL.  In the ED patient is found to be having sinus bradycardia chest x-ray is negative for acute cortical disease, CT head is negative, patient continues to have high blood pressure with systolic in the range of 180s to 200.   Hospital Course:  Cholecystitis, Acute on chronic/Sepsis:  - ? cystic duct inflammatory-mediated occlusion that has improved with antibiotics.  - Appreciate surgery and GI assistance.  - Treated with emperic antibiotics. Abdominal pain worse with food, having difficulty breathing due to abdominal third spacing.  - Pt was deemed to a surgical candidate, pt refused percutaneous tube. Was started on IV antibiotics on admission with  No improvement. - D/w family they will like to move toward comfort care, stop antibiotics and fluids.  - pt has  express that she is tired of fighting that she is in pain and would like to be made comfortable.  - morphine IV PRN. Ativan as needed.  - passed away on 2.6.2015 at 14:45   Acute on chronic kidney disease:  - She was started on IV with no improvement on her Cr. - most likely due to Sepsis.  Time: 60 minutes  Signed:  Marinda ElkFELIZ ORTIZ, Sanam Marmo  [facility] 06/05/2013, 3:56 PM

## 2013-06-17 NOTE — Progress Notes (Signed)
Nutrition Brief Note  RD drawn to chart due to pt with Low Braden score.  Chart reviewed. Pt now transitioning to comfort care.  No further nutrition interventions warranted at this time.  Please re-consult as needed.   Loyce DysKacie Kevyn Boquet, MS RD LDN Clinical Inpatient Dietitian Pager: [phone] Weekend/After hours pager: [phone]

## 2013-06-17 NOTE — Progress Notes (Signed)
Per patient's family request, no extensive assessment or routine vital signs were performed, comfort care only.  Comfort care information packet given to family.

## 2013-06-17 DEATH — deceased

## 2015-01-18 IMAGING — US US ABDOMEN COMPLETE
1 series · 14 of 25 positions shown · non-contrast
Comparison: None available

CLINICAL DATA: Abdominal pain

EXAM:
COMPLETE ABDOMINAL ULTRASOUND

[Series 1: us abdomen complete · 0.21mm/px · 14 of 86 slices shown]
[im 1/86]
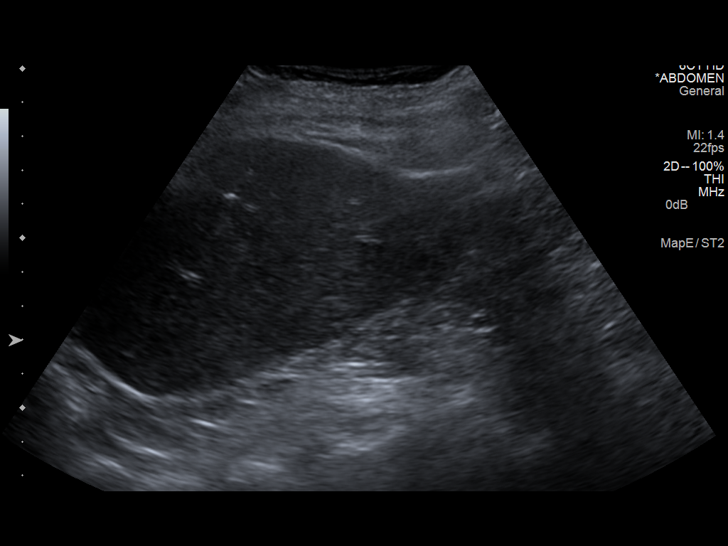
[im 8/86]
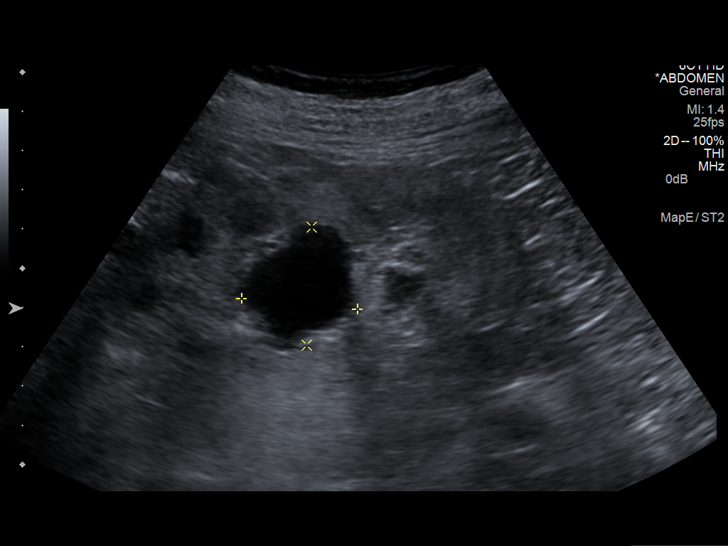
[im 15/86]
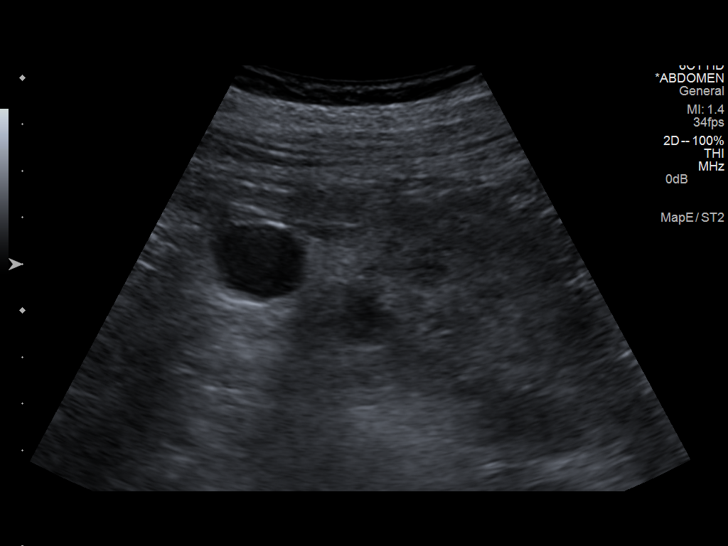
[im 22/86]
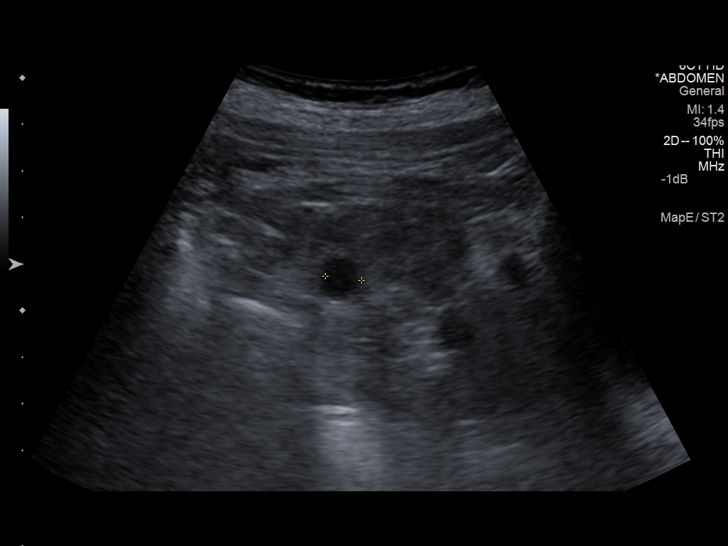
[im 29/86]
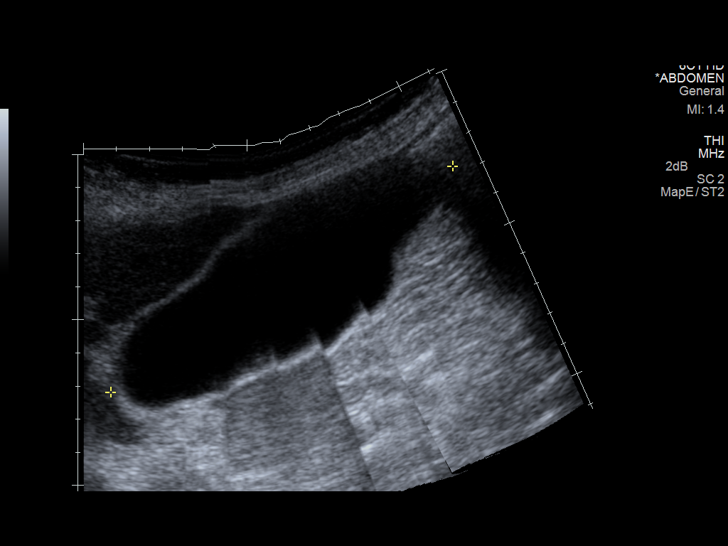
[im 32/86]
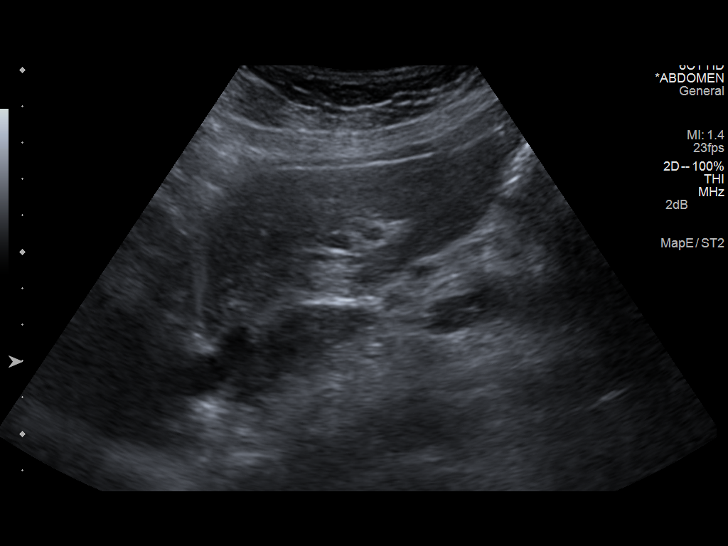
[im 39/86]
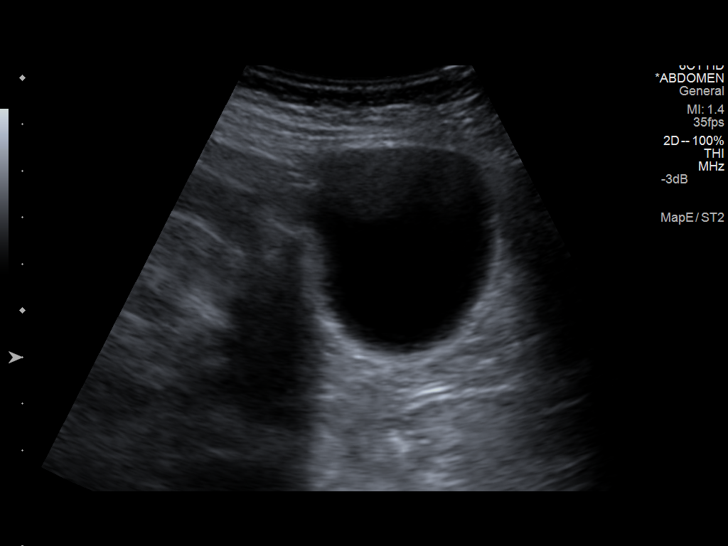
[im 47/86]
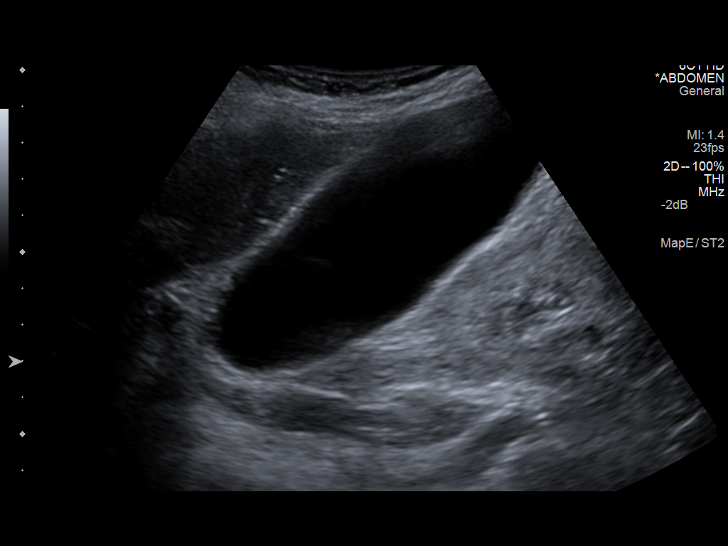
[im 54/86]
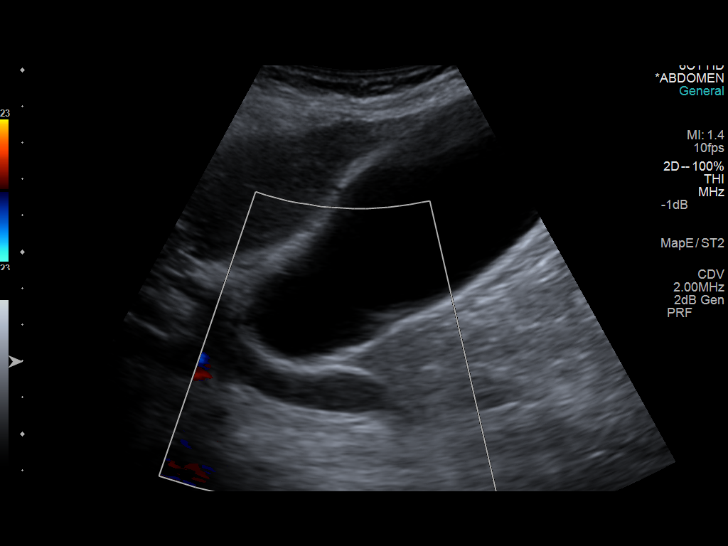
[im 57/86]
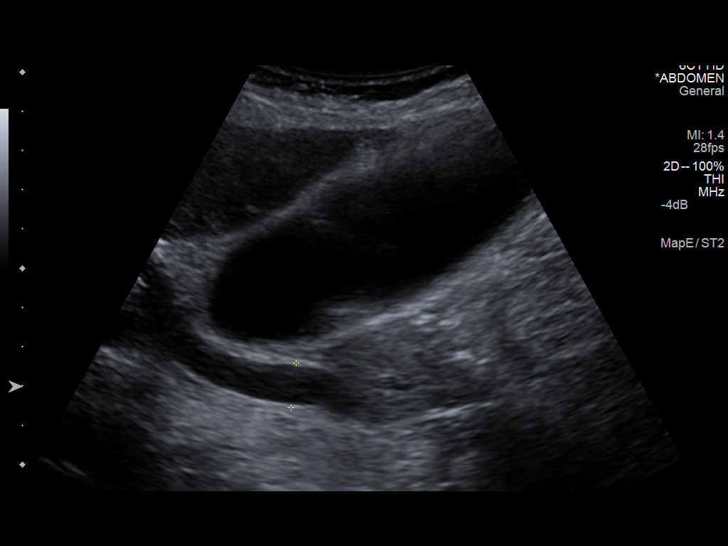
[im 64/86]
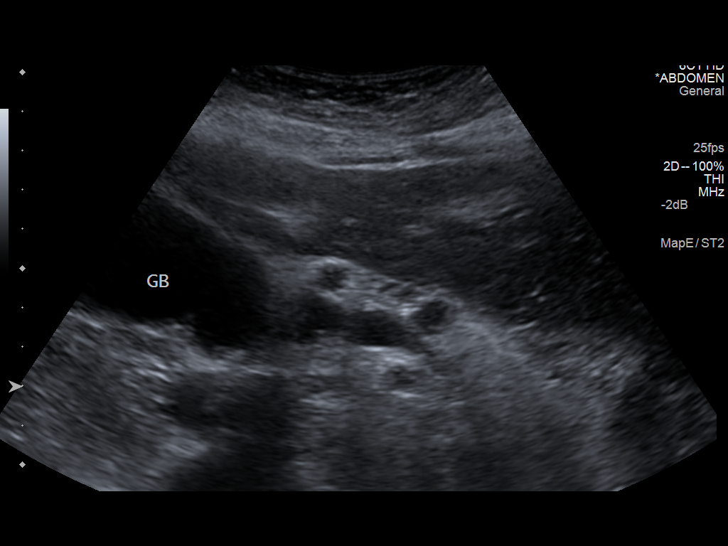
[im 71/86]
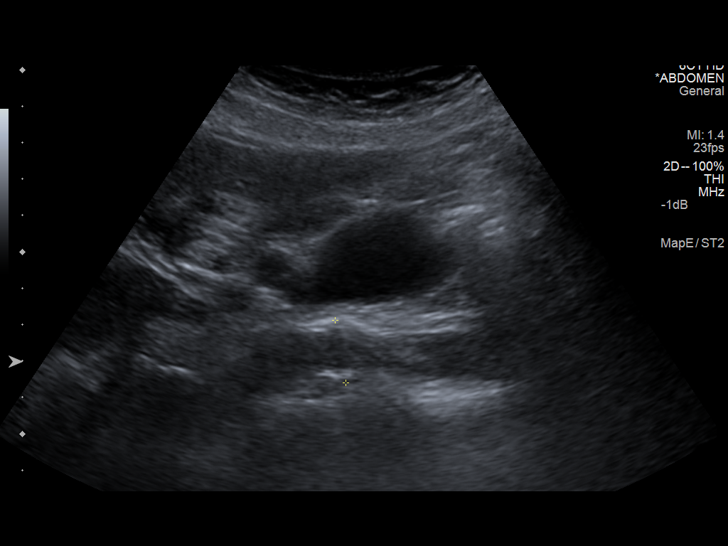
[im 78/86]
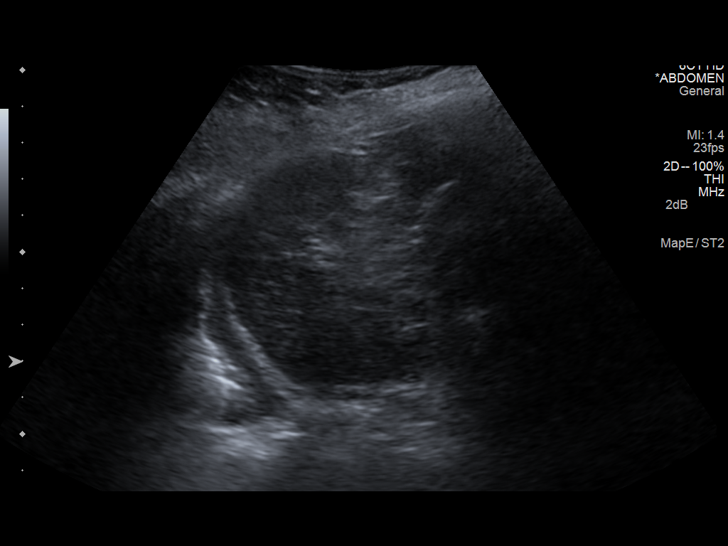
[im 86/86]
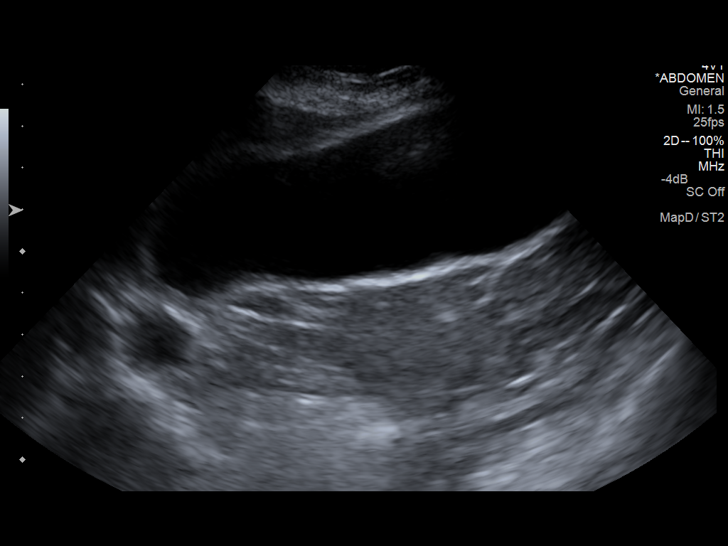

[14 of 25 positions shown; findings below may reference images not displayed]

FINDINGS: Gallbladder: Distended. No gallbladder wall thickening. There is a
single 7 mm stone in the dependent aspect. No pericholecystic fluid.
Sonographer reports no sonographic Murphy's sign.

Common bile duct:  Dilated common 12 mm diameter.

Liver: Suspect mild intrahepatic biliary ductal dilatation. No focal
lesion.

IVC:  Negative

Pancreas:  Negative

Spleen:  No focal lesion, craniocaudal 4.2cm in length.

Right Kidney: Echogenic parenchyma. Multiple cysts, largest in the
upper pole 25 x 29 x 30 mm. No hydronephrosis.

Left Kidney: Not identified. Patient relates history of nephrectomy.

Abdominal aorta:  Atheromatous without evidence of aneurysm
IMPRESSION: 1. Cholelithiasis with distended gallbladder and mild intrahepatic
biliary ductal dilatation suggesting biliary obstruction.
2. Multiple right renal cysts on a background of echogenic
parenchyma, a nonspecific indicator suggesting medical renal
disease.
3. Non visualization of left kidney consistent with history
nephrectomy.

## 2015-01-19 IMAGING — NM NM HEPATOBILIARY IMAGE, INC GB
2 series · 12 of 12 positions shown · non-contrast
Comparison: Abdominal ultrasound 05/20/2013.

CLINICAL DATA: [AGE] female with right upper quadrant pain.
Cholelithiasis and biliary ductal dilatation on ultrasound. Initial
encounter.

EXAM:
NUCLEAR MEDICINE HEPATOBILIARY IMAGING
TECHNIQUE: Sequential images of the abdomen were obtained [DATE] minutes
following intravenous administration of radiopharmaceutical.

[he hepatobiliary · 3.43mm/px · 6 of 60 frames shown (1 of 2)]
[frame 6/60]
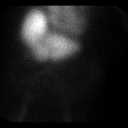
[frame 16/60]
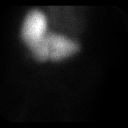
[frame 26/60]
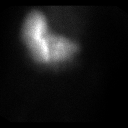
[frame 36/60]
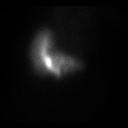
[frame 46/60]
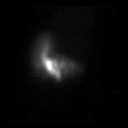
[frame 56/60]
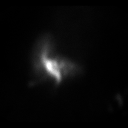

[he hepatobiliary · 3.43mm/px · 6 of 60 frames shown (2 of 2)]
[frame 6/60]
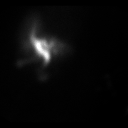
[frame 16/60]
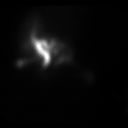
[frame 26/60]
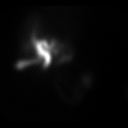
[frame 36/60]
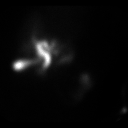
[frame 46/60]
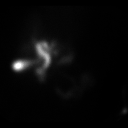
[frame 56/60]
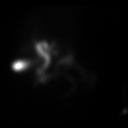

[12 of 12 positions shown; findings below may reference images not displayed]

RADIOPHARMACEUTICALS:  5.0 mCi Jc-44m Choletec

Medication:  2.6 mg IV morphine once.
FINDINGS: Fairly prompt clearance of the blood pool and radiotracer
concentration in the liver. Suggestion of cardiomegaly.

Central biliary tree activity by 30-35 min. No gallbladder activity
after 60 min. There is small bowel activity by 55 min.

The study was then augmented with morphine. The gallbladder
immediately is apparent and progressively fills following the
administration of morphine. At 2 hr residual activity is within the
gallbladder and small bowel.
IMPRESSION: 1. Gallbladder becomes visible only after this study was augmented
with IV morphine, indicating patent cystic duct but raising the
possibility of chronic cholecystitis.
2. Patent CBD, though somewhat delayed appearance of the CBD and
small bowel.
